# Patient Record
Sex: Male | Born: 1990 | Race: White | Hispanic: No | Marital: Single
Health system: Southern US, Community
[De-identification: ages and names within clinical notes are randomized; demographics above are authoritative.]

## PROBLEM LIST (undated history)

## (undated) DIAGNOSIS — F209 Schizophrenia, unspecified: Secondary | ICD-10-CM

## (undated) DIAGNOSIS — F22 Delusional disorders: Secondary | ICD-10-CM

## (undated) DIAGNOSIS — T148XXA Other injury of unspecified body region, initial encounter: Secondary | ICD-10-CM

## (undated) DIAGNOSIS — D649 Anemia, unspecified: Secondary | ICD-10-CM

## (undated) DIAGNOSIS — M79609 Pain in unspecified limb: Secondary | ICD-10-CM

## (undated) DIAGNOSIS — S51809A Unspecified open wound of unspecified forearm, initial encounter: Secondary | ICD-10-CM

## (undated) DIAGNOSIS — S56909A Unspecified injury of unspecified muscles, fascia and tendons at forearm level, unspecified arm, initial encounter: Secondary | ICD-10-CM

## (undated) HISTORY — DX: Pain in unspecified limb: M79.609

## (undated) HISTORY — DX: Unspecified injury of unspecified muscles, fascia and tendons at forearm level, unspecified arm, initial encounter: S56.909A

## (undated) HISTORY — DX: Other injury of unspecified body region, initial encounter: T14.8XXA

## (undated) HISTORY — DX: Unspecified open wound of unspecified forearm, initial encounter: S51.809A

## (undated) HISTORY — DX: Anemia, unspecified: D64.9

---

## 2013-12-21 ENCOUNTER — Other Ambulatory Visit: Payer: Self-pay

## 2013-12-21 ENCOUNTER — Observation Stay: Payer: No Typology Code available for payment source | Admitting: Surgery

## 2013-12-21 ENCOUNTER — Observation Stay
Admission: EM | Admit: 2013-12-21 | Discharge: 2013-12-23 | Disposition: A | Discharge status: Home / Self Care | Payer: No Typology Code available for payment source | Attending: Surgery | Admitting: Surgery

## 2013-12-21 ENCOUNTER — Encounter
Admission: EM | Disposition: A | Discharge status: Home / Self Care | Payer: Self-pay | Source: Home / Self Care | Attending: Emergency Medicine

## 2013-12-21 ENCOUNTER — Observation Stay: Payer: No Typology Code available for payment source | Admitting: Anesthesiology

## 2013-12-21 DIAGNOSIS — S56909A Unspecified injury of unspecified muscles, fascia and tendons at forearm level, unspecified arm, initial encounter: Principal | ICD-10-CM | POA: Insufficient documentation

## 2013-12-21 DIAGNOSIS — S41119A Laceration without foreign body of unspecified upper arm, initial encounter: Secondary | ICD-10-CM | POA: Diagnosis present

## 2013-12-21 DIAGNOSIS — S61409A Unspecified open wound of unspecified hand, initial encounter: Secondary | ICD-10-CM

## 2013-12-21 DIAGNOSIS — S41109A Unspecified open wound of unspecified upper arm, initial encounter: Secondary | ICD-10-CM

## 2013-12-21 DIAGNOSIS — F17200 Nicotine dependence, unspecified, uncomplicated: Secondary | ICD-10-CM | POA: Insufficient documentation

## 2013-12-21 DIAGNOSIS — W268XXA Contact with other sharp object(s), not elsewhere classified, initial encounter: Secondary | ICD-10-CM | POA: Insufficient documentation

## 2013-12-21 DIAGNOSIS — R58 Hemorrhage, not elsewhere classified: Secondary | ICD-10-CM | POA: Diagnosis present

## 2013-12-21 DIAGNOSIS — M79609 Pain in unspecified limb: Secondary | ICD-10-CM

## 2013-12-21 DIAGNOSIS — S61419A Laceration without foreign body of unspecified hand, initial encounter: Secondary | ICD-10-CM | POA: Diagnosis present

## 2013-12-21 DIAGNOSIS — Y92009 Unspecified place in unspecified non-institutional (private) residence as the place of occurrence of the external cause: Secondary | ICD-10-CM | POA: Insufficient documentation

## 2013-12-21 DIAGNOSIS — S51809A Unspecified open wound of unspecified forearm, initial encounter: Secondary | ICD-10-CM | POA: Insufficient documentation

## 2013-12-21 DIAGNOSIS — F1010 Alcohol abuse, uncomplicated: Secondary | ICD-10-CM | POA: Insufficient documentation

## 2013-12-21 DIAGNOSIS — M7989 Other specified soft tissue disorders: Secondary | ICD-10-CM | POA: Diagnosis present

## 2013-12-21 DIAGNOSIS — G8911 Acute pain due to trauma: Secondary | ICD-10-CM

## 2013-12-21 DIAGNOSIS — S51811A Laceration without foreign body of right forearm, initial encounter: Secondary | ICD-10-CM

## 2013-12-21 HISTORY — PX: REPAIR, HAND & ARM, NERVE & TENDON: SHX5251

## 2013-12-21 LAB — CBC AND DIFFERENTIAL
Basophils Absolute Automated: 0.02 10*3/uL (ref 0.00–0.20)
Basophils Automated: 0 %
Eosinophils Absolute Automated: 0.01 10*3/uL (ref 0.00–0.70)
Eosinophils Automated: 0 %
Hematocrit: 33.6 % — ABNORMAL LOW (ref 42.0–52.0)
Hgb: 11.1 g/dL — ABNORMAL LOW (ref 13.0–17.0)
Immature Granulocytes Absolute: 0.02 10*3/uL
Immature Granulocytes: 0 %
Lymphocytes Absolute Automated: 1.04 10*3/uL (ref 0.50–4.40)
Lymphocytes Automated: 11 %
MCH: 29.4 pg (ref 28.0–32.0)
MCHC: 33 g/dL (ref 32.0–36.0)
MCV: 89.1 fL (ref 80.0–100.0)
MPV: 8.2 fL — ABNORMAL LOW (ref 9.4–12.3)
Monocytes Absolute Automated: 0.29 10*3/uL (ref 0.00–1.20)
Monocytes: 3 %
Neutrophils Absolute: 8.31 10*3/uL — ABNORMAL HIGH (ref 1.80–8.10)
Neutrophils: 86 %
Nucleated RBC: 0 /100 WBC (ref 0–1)
Platelets: 226 10*3/uL (ref 140–400)
RBC: 3.77 10*6/uL — ABNORMAL LOW (ref 4.70–6.00)
RDW: 14 % (ref 12–15)
WBC: 9.69 10*3/uL (ref 3.50–10.80)

## 2013-12-21 LAB — PT AND APTT
PT INR: 1.2 — ABNORMAL HIGH (ref 0.9–1.1)
PT: 15 s (ref 12.6–15.0)
PTT: 29 s (ref 23–37)

## 2013-12-21 LAB — BASIC METABOLIC PANEL
BUN: 10 mg/dL (ref 9.0–28.0)
CO2: 21 mEq/L — ABNORMAL LOW (ref 22–29)
Calcium: 7.8 mg/dL — ABNORMAL LOW (ref 8.5–10.5)
Chloride: 108 mEq/L (ref 100–111)
Creatinine: 0.8 mg/dL (ref 0.7–1.3)
Glucose: 110 mg/dL — ABNORMAL HIGH (ref 70–100)
Potassium: 4.4 mEq/L (ref 3.5–5.1)
Sodium: 141 mEq/L (ref 136–145)

## 2013-12-21 LAB — GFR: EGFR: >60

## 2013-12-21 LAB — TYPE AND SCREEN
AB Screen Gel: NEGATIVE
ABO Rh: O NEG

## 2013-12-21 SURGERY — REPAIR, HAND & ARM, NERVE & TENDON
Anesthesia: Anesthesia General | Site: Arm Lower | Laterality: Right | Wound class: Contaminated

## 2013-12-21 MED ORDER — FERROUS SULFATE 324 (65 FE) MG PO TBEC
324.0000 mg | DELAYED_RELEASE_TABLET | Freq: Every morning | ORAL | Status: DC
Start: 2013-12-22 — End: 2013-12-22
  Administered 2013-12-22: 324 mg via ORAL
  Filled 2013-12-21: qty 1

## 2013-12-21 MED ORDER — SODIUM CHLORIDE 0.9 % IV BOLUS
1000.0000 mL | Freq: Once | INTRAVENOUS | Status: AC
Start: 2013-12-21 — End: 2013-12-21
  Administered 2013-12-21: 1000 mL via INTRAVENOUS

## 2013-12-21 MED ORDER — SODIUM CHLORIDE 0.9 % IV MBP
1.0000 g | Freq: Once | INTRAVENOUS | Status: DC
Start: 2013-12-21 — End: 2013-12-23

## 2013-12-21 MED ORDER — MORPHINE SULFATE 4 MG/ML IJ/IV SOLN (WRAP)
4.0000 mg | Status: DC | PRN
Start: 2013-12-21 — End: 2013-12-22
  Administered 2013-12-21 – 2013-12-22 (×5): 4 mg via INTRAVENOUS
  Filled 2013-12-21 (×4): qty 1

## 2013-12-21 MED ORDER — ONDANSETRON HCL 4 MG/2ML IJ SOLN
4.0000 mg | Freq: Two times a day (BID) | INTRAMUSCULAR | Status: DC | PRN
Start: 2013-12-21 — End: 2013-12-22

## 2013-12-21 MED ORDER — ONDANSETRON HCL 4 MG/2ML IJ SOLN
INTRAMUSCULAR | Status: AC
Start: 2013-12-21 — End: 2013-12-21
  Administered 2013-12-21: 4 mg via INTRAVENOUS
  Filled 2013-12-21: qty 2

## 2013-12-21 MED ORDER — MORPHINE SULFATE 4 MG/ML IJ/IV SOLN (WRAP)
Status: AC
Start: 2013-12-21 — End: 2013-12-21
  Administered 2013-12-21: 4 mg via INTRAVENOUS
  Filled 2013-12-21: qty 1

## 2013-12-21 MED ORDER — METOCLOPRAMIDE HCL 5 MG/ML IJ SOLN
INTRAMUSCULAR | Status: AC
Start: 2013-12-21 — End: ?
  Filled 2013-12-21: qty 2

## 2013-12-21 MED ORDER — NEOSTIGMINE METHYLSULFATE 1 MG/ML IJ SOLN
INTRAMUSCULAR | Status: AC
Start: 2013-12-21 — End: ?
  Filled 2013-12-21: qty 10

## 2013-12-21 MED ORDER — LUBRIFRESH P.M. OP OINT
TOPICAL_OINTMENT | OPHTHALMIC | Status: AC
Start: 2013-12-21 — End: ?
  Filled 2013-12-21: qty 3.5

## 2013-12-21 MED ORDER — FENTANYL CITRATE 0.05 MG/ML IJ SOLN
INTRAMUSCULAR | Status: DC | PRN
Start: 2013-12-21 — End: 2013-12-22
  Administered 2013-12-21 (×3): 100 ug via INTRAVENOUS

## 2013-12-21 MED ORDER — DOCUSATE SODIUM 100 MG PO CAPS
100.0000 mg | ORAL_CAPSULE | Freq: Two times a day (BID) | ORAL | Status: DC
Start: 2013-12-21 — End: 2013-12-22
  Administered 2013-12-22 (×2): 100 mg via ORAL
  Filled 2013-12-21 (×3): qty 1

## 2013-12-21 MED ORDER — LIDOCAINE HCL 2 % IJ SOLN
INTRAMUSCULAR | Status: DC | PRN
Start: 2013-12-21 — End: 2013-12-22
  Administered 2013-12-21: 100 mg

## 2013-12-21 MED ORDER — LIDOCAINE HCL (PF) 2 % IJ SOLN
INTRAMUSCULAR | Status: AC
Start: 2013-12-21 — End: ?
  Filled 2013-12-21: qty 5

## 2013-12-21 MED ORDER — ENOXAPARIN SODIUM 30 MG/0.3ML SC SOLN
30.0000 mg | Freq: Two times a day (BID) | SUBCUTANEOUS | Status: DC
Start: 2013-12-21 — End: 2013-12-22
  Administered 2013-12-21 – 2013-12-22 (×2): 30 mg via SUBCUTANEOUS
  Filled 2013-12-21 (×2): qty 0.3

## 2013-12-21 MED ORDER — MORPHINE SULFATE 4 MG/ML IJ/IV SOLN (WRAP)
4.0000 mg | Freq: Once | Status: AC
Start: 2013-12-21 — End: 2013-12-21
  Administered 2013-12-21 (×2): 4 mg via INTRAVENOUS
  Filled 2013-12-21: qty 1

## 2013-12-21 MED ORDER — METOCLOPRAMIDE HCL 5 MG/ML IJ SOLN
INTRAMUSCULAR | Status: DC | PRN
Start: 2013-12-21 — End: 2013-12-22
  Administered 2013-12-21: 10 mg via INTRAVENOUS

## 2013-12-21 MED ORDER — FAMOTIDINE 10 MG/ML IV SOLN (WRAP)
INTRAVENOUS | Status: DC | PRN
Start: 2013-12-21 — End: 2013-12-22
  Administered 2013-12-21: 20 mg via INTRAVENOUS

## 2013-12-21 MED ORDER — ONDANSETRON HCL 4 MG/2ML IJ SOLN
INTRAMUSCULAR | Status: DC | PRN
Start: 2013-12-21 — End: 2013-12-22
  Administered 2013-12-21: 4 mg via INTRAVENOUS

## 2013-12-21 MED ORDER — ROCURONIUM BROMIDE 50 MG/5ML IV SOLN
INTRAVENOUS | Status: AC
Start: 2013-12-21 — End: ?
  Filled 2013-12-21: qty 5

## 2013-12-21 MED ORDER — ACETAMINOPHEN 325 MG PO TABS
650.0000 mg | ORAL_TABLET | ORAL | Status: DC | PRN
Start: 2013-12-21 — End: 2013-12-22
  Administered 2013-12-22: 650 mg via ORAL
  Filled 2013-12-21: qty 2

## 2013-12-21 MED ORDER — ZOLPIDEM TARTRATE 5 MG PO TABS
5.0000 mg | ORAL_TABLET | Freq: Every evening | ORAL | Status: DC | PRN
Start: 2013-12-21 — End: 2013-12-22

## 2013-12-21 MED ORDER — DIPHENHYDRAMINE HCL 25 MG PO CAPS
25.0000 mg | ORAL_CAPSULE | Freq: Two times a day (BID) | ORAL | Status: DC | PRN
Start: 2013-12-21 — End: 2013-12-22

## 2013-12-21 MED ORDER — FENTANYL CITRATE 0.05 MG/ML IJ SOLN
25.0000 ug | Freq: Once | INTRAMUSCULAR | Status: AC
Start: 2013-12-21 — End: 2013-12-21

## 2013-12-21 MED ORDER — FAMOTIDINE 20 MG/2ML IV SOLN
INTRAVENOUS | Status: AC
Start: 2013-12-21 — End: ?
  Filled 2013-12-21: qty 2

## 2013-12-21 MED ORDER — DEXAMETHASONE SODIUM PHOSPHATE 20 MG/5ML IJ SOLN
INTRAMUSCULAR | Status: AC
Start: 2013-12-21 — End: ?
  Filled 2013-12-21: qty 5

## 2013-12-21 MED ORDER — PROPOFOL 10 MG/ML IV EMUL
INTRAVENOUS | Status: AC
Start: 2013-12-21 — End: ?
  Filled 2013-12-21: qty 20

## 2013-12-21 MED ORDER — MIDAZOLAM HCL 2 MG/2ML IJ SOLN
INTRAMUSCULAR | Status: DC | PRN
Start: 2013-12-21 — End: 2013-12-22
  Administered 2013-12-21: 2 mg via INTRAVENOUS

## 2013-12-21 MED ORDER — LACTATED RINGERS IV SOLN
INTRAVENOUS | Status: DC | PRN
Start: 2013-12-21 — End: 2013-12-22

## 2013-12-21 MED ORDER — MORPHINE SULFATE 2 MG/ML IJ/IV SOLN (WRAP)
2.0000 mg | Status: DC | PRN
Start: 2013-12-21 — End: 2013-12-22
  Administered 2013-12-21 – 2013-12-22 (×3): 2 mg via INTRAVENOUS
  Filled 2013-12-21 (×5): qty 1

## 2013-12-21 MED ORDER — ONDANSETRON HCL 4 MG/2ML IJ SOLN
INTRAMUSCULAR | Status: AC
Start: 2013-12-21 — End: ?
  Filled 2013-12-21: qty 2

## 2013-12-21 MED ORDER — DEXAMETHASONE SODIUM PHOSPHATE 4 MG/ML IJ SOLN (WRAP)
INTRAMUSCULAR | Status: DC | PRN
Start: 2013-12-21 — End: 2013-12-22
  Administered 2013-12-21: 10 mg via INTRAVENOUS

## 2013-12-21 MED ORDER — LACTATED RINGERS IV SOLN
INTRAVENOUS | Status: DC
Start: 2013-12-21 — End: 2013-12-22

## 2013-12-21 MED ORDER — ONDANSETRON HCL 4 MG/2ML IJ SOLN
4.0000 mg | Freq: Once | INTRAMUSCULAR | Status: AC
Start: 2013-12-21 — End: 2013-12-21
  Administered 2013-12-21: 4 mg via INTRAVENOUS
  Filled 2013-12-21: qty 2

## 2013-12-21 MED ORDER — FENTANYL CITRATE 0.05 MG/ML IJ SOLN
INTRAMUSCULAR | Status: AC
Start: 2013-12-21 — End: ?
  Filled 2013-12-21: qty 4

## 2013-12-21 MED ORDER — SENNOSIDES-DOCUSATE SODIUM 8.6-50 MG PO TABS
2.0000 | ORAL_TABLET | Freq: Two times a day (BID) | ORAL | Status: DC | PRN
Start: 2013-12-21 — End: 2013-12-23

## 2013-12-21 MED ORDER — FENTANYL CITRATE 0.05 MG/ML IJ SOLN
INTRAMUSCULAR | Status: AC
Start: 2013-12-21 — End: ?
  Filled 2013-12-21: qty 2

## 2013-12-21 MED ORDER — FENTANYL CITRATE 0.05 MG/ML IJ SOLN
INTRAMUSCULAR | Status: AC
Start: 2013-12-21 — End: 2013-12-21
  Administered 2013-12-21: 25 ug via INTRAVENOUS
  Filled 2013-12-21: qty 2

## 2013-12-21 MED ORDER — GLYCOPYRROLATE 0.2 MG/ML IJ SOLN
INTRAMUSCULAR | Status: AC
Start: 2013-12-21 — End: ?
  Filled 2013-12-21: qty 3

## 2013-12-21 MED ORDER — MIDAZOLAM HCL 2 MG/2ML IJ SOLN
INTRAMUSCULAR | Status: AC
Start: 2013-12-21 — End: ?
  Filled 2013-12-21: qty 2

## 2013-12-21 MED ORDER — FENTANYL CITRATE 0.05 MG/ML IJ SOLN
50.0000 ug | Freq: Once | INTRAMUSCULAR | Status: AC
Start: 2013-12-21 — End: 2013-12-21
  Administered 2013-12-21: 50 ug via INTRAVENOUS
  Filled 2013-12-21: qty 2

## 2013-12-21 MED ORDER — HYDROMORPHONE HCL PF 1 MG/ML IJ SOLN
INTRAMUSCULAR | Status: DC | PRN
Start: 2013-12-21 — End: 2013-12-22
  Administered 2013-12-21: 1 mg via INTRAVENOUS
  Administered 2013-12-22 (×2): 0.5 mg via INTRAVENOUS

## 2013-12-21 MED ORDER — CEFAZOLIN SODIUM 1 G IJ SOLR
INTRAMUSCULAR | Status: DC | PRN
Start: 2013-12-21 — End: 2013-12-22
  Administered 2013-12-21: 2 g via INTRAVENOUS

## 2013-12-21 MED ORDER — MORPHINE SULFATE 4 MG/ML IJ/IV SOLN (WRAP)
Status: AC
Start: 2013-12-21 — End: 2013-12-22
  Filled 2013-12-21: qty 1

## 2013-12-21 MED ORDER — SODIUM CHLORIDE 0.9 % IR SOLN
Status: DC | PRN
Start: 2013-12-21 — End: 2013-12-22
  Administered 2013-12-21: 1000 mL

## 2013-12-21 MED ORDER — PROPOFOL INFUSION 10 MG/ML
INTRAVENOUS | Status: DC | PRN
Start: 2013-12-21 — End: 2013-12-22
  Administered 2013-12-21: 100 mg via INTRAVENOUS
  Administered 2013-12-21: 200 mg via INTRAVENOUS
  Administered 2013-12-21: 100 mg via INTRAVENOUS

## 2013-12-21 MED ORDER — BACITRACIN 50000 UNITS IM SOLR
INTRAMUSCULAR | Status: DC | PRN
Start: 2013-12-21 — End: 2013-12-22
  Administered 2013-12-21: 50000 [IU]

## 2013-12-21 MED ORDER — ROCURONIUM BROMIDE 50 MG/5ML IV SOLN
INTRAVENOUS | Status: DC | PRN
Start: 2013-12-21 — End: 2013-12-22
  Administered 2013-12-21: 40 mg via INTRAVENOUS

## 2013-12-21 MED ORDER — FENTANYL CITRATE 0.05 MG/ML IJ SOLN
50.0000 ug | Freq: Once | INTRAMUSCULAR | Status: AC
Start: 2013-12-21 — End: 2013-12-21
  Administered 2013-12-21: 50 ug via INTRAVENOUS

## 2013-12-21 MED ORDER — LIDOCAINE-EPINEPHRINE 1 %-1:100000 IJ SOLN
INTRAMUSCULAR | Status: DC | PRN
Start: 2013-12-21 — End: 2013-12-22
  Administered 2013-12-21: 20 mL

## 2013-12-21 MED ORDER — TRAMADOL HCL 50 MG PO TABS
50.0000 mg | ORAL_TABLET | Freq: Four times a day (QID) | ORAL | Status: DC
Start: 2013-12-22 — End: 2013-12-22
  Administered 2013-12-22 (×2): 50 mg via ORAL
  Filled 2013-12-21 (×3): qty 1

## 2013-12-21 MED ORDER — VITAMIN C 500 MG PO TABS
500.0000 mg | ORAL_TABLET | Freq: Every morning | ORAL | Status: DC
Start: 2013-12-22 — End: 2013-12-22
  Administered 2013-12-22: 500 mg via ORAL
  Filled 2013-12-21: qty 1

## 2013-12-21 SURGICAL SUPPLY — 8 items
.035 K wire ×2 IMPLANT
BANDAGE ACE NONSTERILE 4IN LF (Bandage) ×1
BANDAGE COMPRESSION L5 YD X W4 IN ELASTIC HOOK LOOP CLOSURE STRETCH (Bandage) ×1 IMPLANT
BANDAGE MEDLINE COMPRESSION L5 YD X W4 (Bandage) ×1
DRESSING ADAPTIC NON-ADH ROLL (Dressing) ×2 IMPLANT
DRESSING FLEXZAN 4X4 (Dressing) ×2 IMPLANT
GAUZE KERLIX 4.5X4YDS (Dressing) ×2 IMPLANT
SPLINT ONESTEP 4 X 30 FIBERGL (Dressing) ×2 IMPLANT

## 2013-12-21 NOTE — ED Notes (Signed)
Pt called out c/o pain. Resident notified and gave verbal order for fentanyl IV. Went to bedside to give med and pt using profanity and threatening to leave hospital. Expressed to pt that his risks of leaving the hospital have already been discussed with him and that he may leave if he wants to. Pt continues to swear at this RN. Assisted pt to reposition, warm blankets given to pt and girlfriend. Lights dimmed. Awaiting hand surgery. Resident notified of pt voicing desire to leave.

## 2013-12-21 NOTE — ED Notes (Signed)
Trauma surgery at bedside.
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
## 2013-12-21 NOTE — ED Notes (Addendum)
See quick triage note. Resident at bedside unwrapping pressure dressing. Actively bleeding and approximately 5 inches in length, spanning circumference of upper forearm. Pt with distal PMS to that extremity.

## 2013-12-21 NOTE — H&P (Signed)
I examined the patient  No interval changes to the H and P

## 2013-12-21 NOTE — ED Notes (Signed)
Called to bedside, wound bleeding again. Resident notified.

## 2013-12-21 NOTE — ED Notes (Signed)
Bed: S 20  Expected date:   Expected time:   Means of arrival: [facility]  Comments:  Lye,Bayani

## 2013-12-21 NOTE — ED Notes (Signed)
Pt threatening to leave if he cannot have something to drink. Pt and girlfriend informed that pt may not drink but he may have mouth swabs. Pt refusing mouth swabs. Pt using profanity and swearing at this RN and trauma surgeon. Charge RN aware.

## 2013-12-21 NOTE — Plan of Care (Signed)
Report given to preop. Pt to be transported.

## 2013-12-21 NOTE — Plan of Care (Signed)
Problem: Safety  Goal: Patient will be free from injury during hospitalization  Outcome: Progressing  Call light within reach. Telephone at bedside. Encouraged to call for assistance if needed. Hourly rounding. Significant other at bedside.    Problem: Pain  Goal: Patient's pain/discomfort is manageable  Outcome: Progressing  Rates pain as 5-8; goal of <5. Morphine 2-4mg  Q2H PRN. Will assess/reassess.    Comments:   Plan is for surgery today.

## 2013-12-21 NOTE — ED Notes (Signed)
Patient and family member sleeping at this time.
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
## 2013-12-21 NOTE — Plan of Care (Signed)
Pt arrived on floor. VSS. A&Ox4. Dressing to RUE with sanguinous drainage. IVF started.

## 2013-12-21 NOTE — Progress Notes (Addendum)
Dr Lucianne Muss contacted for admission orders. Telephone orders taken for initial orders. Pt to be going to OR shortly.

## 2013-12-21 NOTE — Progress Notes (Signed)
Pt with increasing pain, OR on hold for this case r/t emergency case.  Dr. Scherrie Merritts called and orders to give floor pain med.  See MAR>

## 2013-12-21 NOTE — Anesthesia Preprocedure Evaluation (Signed)
Anesthesia Evaluation    AIRWAY    Mallampati: I    TM distance: >3 FB  Neck ROM: full  Mouth Opening:full   CARDIOVASCULAR    cardiovascular exam normal       DENTAL    no notable dental hx     PULMONARY    pulmonary exam normal     OTHER FINDINGS                      Anesthesia Plan    ASA 1     general                     intravenous induction   Detailed anesthesia plan: general LMA        Post op pain management: per surgeon    informed consent obtained    Plan discussed with CRNA and attending.

## 2013-12-21 NOTE — ED Notes (Signed)
Dr. Sherlon Handing at bedside. Wound irrigated, quik clot applied and wound redressed.

## 2013-12-21 NOTE — OR Nursing (Signed)
Post-op diagnosis:  Right long finger injury ulnar digital artery, severe compression ulnar digital nerve, Right index finger 13x57mm wound distal phalanx, 9x73mm wound PIP joint, 9x108mm wound right volar wrist, 8x11com medial right elbow wound, 7x3.6com lateral right elbow injury, flexor carpi radialis injury medial antebrachial cutaneous nerve, basilic vein injury
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
## 2013-12-22 ENCOUNTER — Encounter: Payer: Self-pay | Admitting: Anesthesiology

## 2013-12-22 ENCOUNTER — Encounter
Admission: EM | Disposition: A | Discharge status: Home / Self Care | Payer: Self-pay | Source: Home / Self Care | Attending: Emergency Medicine

## 2013-12-22 DIAGNOSIS — S51809A Unspecified open wound of unspecified forearm, initial encounter: Secondary | ICD-10-CM

## 2013-12-22 LAB — BASIC METABOLIC PANEL
BUN: 8 mg/dL — ABNORMAL LOW (ref 9.0–28.0)
CO2: 27 mEq/L (ref 22–29)
Calcium: 8.2 mg/dL — ABNORMAL LOW (ref 8.5–10.5)
Chloride: 101 mEq/L (ref 100–111)
Creatinine: 0.8 mg/dL (ref 0.7–1.3)
Glucose: 123 mg/dL — ABNORMAL HIGH (ref 70–100)
Potassium: 4.1 mEq/L (ref 3.5–5.1)
Sodium: 132 mEq/L — ABNORMAL LOW (ref 136–145)

## 2013-12-22 LAB — CBC
Hematocrit: 28.7 % — ABNORMAL LOW (ref 42.0–52.0)
Hgb: 9.4 g/dL — ABNORMAL LOW (ref 13.0–17.0)
MCH: 29.3 pg (ref 28.0–32.0)
MCHC: 32.8 g/dL (ref 32.0–36.0)
MCV: 89.4 fL (ref 80.0–100.0)
MPV: 8.4 fL — ABNORMAL LOW (ref 9.4–12.3)
Nucleated RBC: 0 /100 WBC (ref 0–1)
Platelets: 194 10*3/uL (ref 140–400)
RBC: 3.21 10*6/uL — ABNORMAL LOW (ref 4.70–6.00)
RDW: 14 % (ref 12–15)
WBC: 7.16 10*3/uL (ref 3.50–10.80)

## 2013-12-22 LAB — GFR: EGFR: >60

## 2013-12-22 SURGERY — BLOCK, BRACHIAL PLEXUS, SINGLE
Anesthesia: Anesthesia General

## 2013-12-22 MED ORDER — HYDROMORPHONE HCL 2 MG PO TABS
2.0000 mg | ORAL_TABLET | ORAL | Status: DC | PRN
Start: 2013-12-22 — End: 2013-12-23

## 2013-12-22 MED ORDER — OXYCODONE-ACETAMINOPHEN 5-325 MG PO TABS
1.0000 | ORAL_TABLET | ORAL | Status: DC | PRN
Start: 2013-12-22 — End: 2013-12-22

## 2013-12-22 MED ORDER — ONDANSETRON HCL 4 MG/2ML IJ SOLN
4.0000 mg | Freq: Once | INTRAMUSCULAR | Status: DC | PRN
Start: 2013-12-22 — End: 2013-12-22

## 2013-12-22 MED ORDER — BUPIVACAINE-EPINEPHRINE (PF) 0.5% -1:200000 IJ SOLN
INTRAMUSCULAR | Status: DC | PRN
Start: 2013-12-22 — End: 2013-12-22
  Administered 2013-12-22: 20 mL via INTRAMUSCULAR

## 2013-12-22 MED ORDER — BACITRACIN ZINC 500 UNIT/GM EX OINT
TOPICAL_OINTMENT | CUTANEOUS | Status: DC | PRN
Start: 2013-12-22 — End: 2013-12-22
  Administered 2013-12-22: 1 via TOPICAL

## 2013-12-22 MED ORDER — HYDROMORPHONE HCL 2 MG PO TABS
2.0000 mg | ORAL_TABLET | ORAL | Status: DC | PRN
Start: 2013-12-22 — End: 2013-12-23
  Administered 2013-12-22: 2 mg via ORAL
  Filled 2013-12-22: qty 1

## 2013-12-22 MED ORDER — HYDROMORPHONE HCL 2 MG PO TABS
2.0000 mg | ORAL_TABLET | ORAL | Status: DC | PRN
Start: 2013-12-22 — End: 2013-12-22
  Administered 2013-12-22 (×3): 2 mg via ORAL
  Filled 2013-12-22 (×3): qty 1

## 2013-12-22 MED ORDER — FENTANYL CITRATE 0.05 MG/ML IJ SOLN
25.0000 ug | INTRAMUSCULAR | Status: DC | PRN
Start: 2013-12-22 — End: 2013-12-22

## 2013-12-22 MED ORDER — HYDROMORPHONE HCL PF 1 MG/ML IJ SOLN
INTRAMUSCULAR | Status: AC
Start: 2013-12-22 — End: ?
  Filled 2013-12-22: qty 1

## 2013-12-22 MED ORDER — HYDROMORPHONE HCL PF 1 MG/ML IJ SOLN
0.5000 mg | INTRAMUSCULAR | Status: DC | PRN
Start: 2013-12-22 — End: 2013-12-22

## 2013-12-22 MED ORDER — HYDROMORPHONE HCL 2 MG PO TABS
2.0000 mg | ORAL_TABLET | ORAL | Status: DC | PRN
Start: 2013-12-22 — End: 2014-02-17

## 2013-12-22 MED ORDER — SENNOSIDES-DOCUSATE SODIUM 8.6-50 MG PO TABS
2.0000 | ORAL_TABLET | Freq: Two times a day (BID) | ORAL | Status: DC | PRN
Start: 2013-12-22 — End: 2014-02-17

## 2013-12-22 MED ORDER — GLYCOPYRROLATE 0.2 MG/ML IJ SOLN
INTRAMUSCULAR | Status: DC | PRN
Start: 2013-12-22 — End: 2013-12-22
  Administered 2013-12-22: .8 mg via INTRAVENOUS

## 2013-12-22 MED ORDER — OXYCODONE-ACETAMINOPHEN 5-325 MG PO TABS
1.0000 | ORAL_TABLET | Freq: Once | ORAL | Status: AC | PRN
Start: 2013-12-22 — End: 2013-12-22
  Administered 2013-12-22: 1 via ORAL
  Filled 2013-12-22: qty 1

## 2013-12-22 MED ORDER — SULFAMETHOXAZOLE-TMP DS 800-160 MG PO TABS
1.0000 | ORAL_TABLET | Freq: Two times a day (BID) | ORAL | Status: DC
Start: 2013-12-23 — End: 2013-12-23
  Administered 2013-12-22: 1 via ORAL
  Filled 2013-12-22: qty 1

## 2013-12-22 MED ORDER — PROMETHAZINE HCL 25 MG/ML IJ SOLN
6.2500 mg | Freq: Once | INTRAMUSCULAR | Status: DC | PRN
Start: 2013-12-22 — End: 2013-12-22

## 2013-12-22 MED ORDER — NEOSTIGMINE METHYLSULFATE 1 MG/ML IJ SOLN
INTRAMUSCULAR | Status: DC | PRN
Start: 2013-12-22 — End: 2013-12-22
  Administered 2013-12-22: 4 mg via INTRAVENOUS

## 2013-12-22 MED ORDER — SULFAMETHOXAZOLE-TMP DS 800-160 MG PO TABS
1.0000 | ORAL_TABLET | Freq: Two times a day (BID) | ORAL | Status: DC
Start: 2013-12-23 — End: 2014-02-17
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
## 2013-12-22 NOTE — OR Nursing (Signed)
See MD OP note for complete procedure
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
## 2013-12-22 NOTE — Plan of Care (Signed)
Problem: Pain  Goal: Patient's pain/discomfort is manageable  Outcome: Progressing  Medicated with dilaudid 2 mg po for RUE pain 5 out of 10.RUE with splint and ace wrap cdi,elevated with pillow.Able to wiggle all fingers and warm to touch.Denies numbness or tingling.

## 2013-12-22 NOTE — Progress Notes (Signed)
Called Dr. Remus Blake office, and spoke with [facility], Museum/gallery conservator .

## 2013-12-22 NOTE — Consults (Signed)
ANESTHESIOLOGY INPATIENT PAIN SERVICE CONSULTATION    Assessment:   POD #1 s/p hand and arm laceration repair.   Taking IV Morphine 4 mg and PO Percocet.   Just taking 2 mg Dilaudid q4h PRN with analgesia    Plan:   Would advocate for Dilaudid 2 mg PO q2h prn as it is unlikely to last for 4 hours   Will isgn off this case.  Recontact [place] if you need additional help  As a consultation service we will not be writing for titration of the opioids.  Please increase or decrease from this starting dosage as needed.  A patient with continuous pain may derive benefit from a long acting opioid dosage in addition to the short acting narcotics.  Side effects of somnolence, constipation, nausea, and respiratory depression are more frequent with increasing administrations.    Pain Rx:  As above and below    All Rx:  Scheduled Meds:  Current Facility-Administered Medications   Medication Dose Route Frequency   . ceFAZolin  1 g Intravenous Once   . docusate sodium  100 mg Oral BID   . enoxaparin  30 mg Subcutaneous Q12H   . Ferrous Sulfate  324 mg Oral QAM W/BREAKFAST   . traMADol  50 mg Oral 4 times per day   . vitamin C  500 mg Oral QAM W/BREAKFAST     Continuous Infusions:  . lactated ringers 100 mL/hr at 12/21/13 0817   . lactated ringers 100 mL/hr at 12/22/13 0259     PRN Meds:.acetaminophen, diphenhydrAMINE, fentaNYL, HYDROmorphone, HYDROmorphone, morphine, morphine, ondansetron, ondansetron, oxyCODONE-acetaminophen, promethazine, senna-docusate, zolpidem    Diet:  Diet regular    Interval History:  Patient reports mild pain.  Pain Location: right forearm and hand  Comments: coping with current regimen    History:  Karl [name] is a 23 y.o. male admitted on 12/21/2013 with Laceration of right forearm, initial encounter [881.00]  Laceration of arm, right, initial encounter [884.0].  LOS:  LOS: 1 day     Surgery: s/p Procedure(s):  REPAIR, HAND & ARM, NERVE & TENDON for Laceration of arm, right, initial encounter [884.0].     POD: 1 Day Post-Op    Additional History:  Past Medical History   Diagnosis Date   . Back pain    . Coma      history     History reviewed. No pertinent past surgical history.    Allergies:  No Known Allergies    Review of Systems:  RR: 16/min  Side effects: no:  None, but will have constipation.    Additional ROS:      Exam:  Filed Vitals:    12/22/13 1125   BP: 114/51   Pulse: 70   Temp: 36.2 C (97.2 F)   Resp: 16   SpO2: 97%       Level of Consciousness: awake and alert   Additional Exam:      Labs:  Recent Labs      12/22/13   0142   WBC  7.16   HEMOGLOBIN  9.4*   HEMATOCRIT  28.7*     No results for input(s): PT, INR, PTT in the last 24 hours.  Recent Labs      12/22/13   0142   BUN  8.0*   CREATININE  0.8   POTASSIUM  4.1       Level of Service:  2    Signature:  Avanell Shackleton 12/22/2013 12:59 PM

## 2013-12-22 NOTE — Anesthesia Postprocedure Evaluation (Signed)
The patient is awake or easily arousable.      The patients respirations, and cardiovascular status have been evaluated and deemed stable post op.     Post op nausea, vomiting and pain have been treated and controlled as effectively as possible without compromising the patients respiratory and cardiovascular status.    Please refer to Post Op PACU Documentation for confirmation of attainment of normothermia and adequate hydration status.    There were no obvious anesthetic related complications.

## 2013-12-22 NOTE — Op Note (Signed)
Right forearm/wrist/long and index finger traumatic lacerations explorations  Right forearm irrigation and debridement skin, subcutaneous tissue and muscle  Neurolysis ulnar nerve proximal forearm  Local rotational flap to cover a 8x11 cm medial proximal forearm defect  Ligation of cephalic vein right forearm  Repair Flexor carpi radialis right  Irrigation and debridement skin and muscle right lateral elbow   Local rotational flap to cover a 7 x 3.6 cm open wound   Resection lateral antebrachial cutaneous nerve fascicle with muscle implantation   Irrigation debridement right volar radial wrist wound skin and subcutaneous tissue  Vy advancement flap to cover 9x13 mm open wound   Percutaenous pinning right index DIP joint  Irrigation right index volar dip and pip skin and subcutaneous tissue  Full thickness skin graft to cover a 9 x9 mm wound PIP joint and 13 x 9 Distal phalanx wound  Irrigation and debridement right long finger   Ligation ulnar digital vein right long finger  Neurolysis ulnar proper digital nerve right long finger  Vascular assessment right arterial inflow       Surgeon: Sheritha Louis   Anesthesia: General  IVF: 1600  EBL 95 cc  TT: 27 min  Drains: one JP  Disposition: inpatient for acute pain mgmt, IV antibiotics, discharge planning; elevate right arm and follow up with Dr Lucianne Muss in 7 days call [phone]; teach JP drain care

## 2013-12-22 NOTE — Plan of Care (Signed)
Problem: Pain  Goal: Patient's pain/discomfort is manageable  Outcome: Progressing  Pain level of 6/10 pain medicine given with relief.
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
## 2013-12-22 NOTE — Transfer of Care (Signed)
Anesthesia Transfer of Care Note    Patient: Karl [name]    Procedures performed: Procedure(s):  REPAIR, HAND & ARM, NERVE & TENDON    Anesthesia type: General ETT    Patient location:Phase I PACU    Last vitals:   Filed Vitals:    12/22/13 0043   BP: 151/81   Pulse: 98   Temp: 36.8 C (98.2 F)   Resp: 24   SpO2: 99%       Post pain: Patient not complaining of pain, continue current therapy      Mental Status:awake and alert     Respiratory Function: tolerating face mask    Cardiovascular: stable    Nausea/Vomiting: patient not complaining of nausea or vomiting    Hydration Status: adequate    Post assessment: no apparent anesthetic complications, no reportable events and no evidence of recall

## 2013-12-22 NOTE — Progress Notes (Signed)
Patient verbalized pain now controled with PO Dilaudid. No noted side effects, tolerating diet well and verbalized ready to [place] once seen by the doctor.

## 2013-12-22 NOTE — Progress Notes (Signed)
Pt alert and oriented x 4.Denies sob or chest pain.Pt upset waiting for physician to see him for discharge.Called and spoke with Dr.Hashemi ,came in to see pt.Discharge instructions given and explained to pt including prescriptions,verbalizes understanding.Pt doesn't want wheelchair assist for discharge.Pt discharged  Accompanied by girlfriend with all belongings ,ambulatory with steady gait.

## 2013-12-22 NOTE — Discharge Instructions (Signed)
No smoking  Keep right hand elevated  Plastic bag cover for showering  6 pack finger exercises  JP drain care (record every 12 hours)  Call hand therapist  See Dr Lucianne Muss in 1 week

## 2013-12-22 NOTE — OR PreOp (Signed)
Pt arrived to preop.  No nerve block needed at this time per Dr. Marny Lowenstein.

## 2013-12-22 NOTE — Progress Notes (Signed)
Patient was seen and examined.  5/10 pain  No headache or no chest pain    Wound cultures: no growth to date    Physical Exam:   Right hand: finger warm, no evidence of superinfection outside of the splint  Right index: k wire clean and dry    Impression: Degloving injury right forearm/wrist and index/long/ring fingers    Plan:    Per patient request, discharge home  Dilaudid for postop pain  Bactrim antibiotics  Colace for constipation

## 2013-12-23 LAB — ECG 12-LEAD
Atrial Rate: 68 {beats}/min
P Axis: 65 degrees
P-R Interval: 132 ms
Q-T Interval: 396 ms
QRS Duration: 80 ms
QTC Calculation (Bezet): 421 ms
R Axis: 49 degrees
T Axis: 54 degrees
Ventricular Rate: 68 {beats}/min

## 2013-12-27 ENCOUNTER — Encounter: Payer: Self-pay | Admitting: Surgery

## 2014-01-28 ENCOUNTER — Ambulatory Visit: Payer: No Typology Code available for payment source

## 2014-01-28 NOTE — Pre-Procedure Instructions (Signed)
01/28/14 Preoperative labs and EKG notes reviewed in Epic.

## 2014-01-30 ENCOUNTER — Ambulatory Visit: Admit: 2014-01-30 | Payer: No Typology Code available for payment source | Admitting: Surgery

## 2014-01-30 SURGERY — REMOVAL, HARDWARE, SIMPLE, NON-SPINE
Anesthesia: General | Laterality: Right

## 2014-02-04 ENCOUNTER — Ambulatory Visit
Admission: RE | Admit: 2014-02-04 | Payer: No Typology Code available for payment source | Source: Ambulatory Visit | Admitting: Surgery

## 2014-02-04 ENCOUNTER — Encounter: Admission: RE | Payer: Self-pay | Source: Ambulatory Visit

## 2014-02-04 SURGERY — REMOVAL, HARDWARE, SIMPLE, NON-SPINE
Anesthesia: General | Laterality: Right

## 2014-02-17 ENCOUNTER — Ambulatory Visit: Payer: No Typology Code available for payment source

## 2014-02-17 ENCOUNTER — Encounter: Payer: Self-pay | Admitting: Anesthesiology

## 2014-02-17 ENCOUNTER — Ambulatory Visit: Payer: No Typology Code available for payment source | Admitting: Anesthesiology

## 2014-02-17 ENCOUNTER — Encounter
Admission: RE | Disposition: A | Discharge status: Home / Self Care | Payer: Self-pay | Source: Ambulatory Visit | Attending: Surgery

## 2014-02-17 ENCOUNTER — Ambulatory Visit: Payer: No Typology Code available for payment source | Admitting: Surgery

## 2014-02-17 ENCOUNTER — Ambulatory Visit
Admission: RE | Admit: 2014-02-17 | Discharge: 2014-02-17 | Disposition: A | Discharge status: Home / Self Care | Payer: No Typology Code available for payment source | Source: Ambulatory Visit | Attending: Surgery | Admitting: Surgery

## 2014-02-17 DIAGNOSIS — S51801S Unspecified open wound of right forearm, sequela: Secondary | ICD-10-CM

## 2014-02-17 DIAGNOSIS — F17200 Nicotine dependence, unspecified, uncomplicated: Secondary | ICD-10-CM | POA: Insufficient documentation

## 2014-02-17 DIAGNOSIS — Z1889 Other specified retained foreign body fragments: Secondary | ICD-10-CM | POA: Insufficient documentation

## 2014-02-17 DIAGNOSIS — Z472 Encounter for removal of internal fixation device: Secondary | ICD-10-CM | POA: Insufficient documentation

## 2014-02-17 DIAGNOSIS — M795 Residual foreign body in soft tissue: Secondary | ICD-10-CM | POA: Insufficient documentation

## 2014-02-17 DIAGNOSIS — D649 Anemia, unspecified: Secondary | ICD-10-CM | POA: Insufficient documentation

## 2014-02-17 HISTORY — PX: REMOVAL, HARDWARE, SIMPLE, NON-SPINE: SHX5122

## 2014-02-17 HISTORY — PX: DEBRIDEMENT & IRRIGATION, UPPER EXTREMITY: SHX3695

## 2014-02-17 SURGERY — REMOVAL, HARDWARE, SIMPLE, NON-SPINE
Anesthesia: Anesthesia General | Site: Finger | Laterality: Right | Wound class: Clean

## 2014-02-17 MED ORDER — BUPIVACAINE-EPINEPHRINE (PF) 0.5% -1:200000 IJ SOLN
INTRAMUSCULAR | Status: DC
Start: 2014-02-17 — End: 2014-02-17
  Filled 2014-02-17: qty 20

## 2014-02-17 MED ORDER — PROPOFOL 10 MG/ML IV EMUL
INTRAVENOUS | Status: AC
Start: 2014-02-17 — End: ?
  Filled 2014-02-17: qty 50

## 2014-02-17 MED ORDER — PROPOFOL INFUSION 10 MG/ML
INTRAVENOUS | Status: DC | PRN
Start: 2014-02-17 — End: 2014-02-17
  Administered 2014-02-17: 300 ug/kg/min via INTRAVENOUS

## 2014-02-17 MED ORDER — LIDOCAINE HCL (PF) 2 % IJ SOLN
INTRAMUSCULAR | Status: AC
Start: 2014-02-17 — End: ?
  Filled 2014-02-17: qty 5

## 2014-02-17 MED ORDER — FENTANYL CITRATE 0.05 MG/ML IJ SOLN
INTRAMUSCULAR | Status: AC
Start: 2014-02-17 — End: ?
  Filled 2014-02-17: qty 2

## 2014-02-17 MED ORDER — GLYCOPYRROLATE 0.2 MG/ML IJ SOLN
INTRAMUSCULAR | Status: AC
Start: 2014-02-17 — End: ?
  Filled 2014-02-17: qty 1

## 2014-02-17 MED ORDER — FENTANYL CITRATE 0.05 MG/ML IJ SOLN
INTRAMUSCULAR | Status: DC | PRN
Start: 2014-02-17 — End: 2014-02-17
  Administered 2014-02-17 (×2): 50 ug via INTRAVENOUS

## 2014-02-17 MED ORDER — SODIUM CHLORIDE 0.9 % IV MBP
1.0000 g | Freq: Three times a day (TID) | INTRAVENOUS | Status: DC
Start: 2014-02-17 — End: 2014-02-17
  Administered 2014-02-17: 1 g via INTRAVENOUS

## 2014-02-17 MED ORDER — PROPOFOL INFUSION 10 MG/ML
INTRAVENOUS | Status: DC | PRN
Start: 2014-02-17 — End: 2014-02-17
  Administered 2014-02-17: 50 mg via INTRAVENOUS
  Administered 2014-02-17: 200 mg via INTRAVENOUS
  Administered 2014-02-17 (×3): 50 mg via INTRAVENOUS

## 2014-02-17 MED ORDER — GLYCOPYRROLATE 0.2 MG/ML IJ SOLN
INTRAMUSCULAR | Status: DC | PRN
Start: 2014-02-17 — End: 2014-02-17
  Administered 2014-02-17: 0.2 mg via INTRAVENOUS

## 2014-02-17 MED ORDER — MIDAZOLAM HCL 2 MG/2ML IJ SOLN
INTRAMUSCULAR | Status: AC
Start: 2014-02-17 — End: ?
  Filled 2014-02-17: qty 2

## 2014-02-17 MED ORDER — ONDANSETRON HCL 4 MG/2ML IJ SOLN
INTRAMUSCULAR | Status: AC
Start: 2014-02-17 — End: ?
  Filled 2014-02-17: qty 2

## 2014-02-17 MED ORDER — MIDAZOLAM HCL 2 MG/2ML IJ SOLN
INTRAMUSCULAR | Status: DC | PRN
Start: 2014-02-17 — End: 2014-02-17
  Administered 2014-02-17: 2 mg via INTRAVENOUS
  Administered 2014-02-17 (×2): 1 mg via INTRAVENOUS

## 2014-02-17 MED ORDER — CEFAZOLIN SODIUM 1 G IJ SOLR
INTRAMUSCULAR | Status: AC
Start: 2014-02-17 — End: ?
  Filled 2014-02-17: qty 1000

## 2014-02-17 MED ORDER — PROPOFOL 10 MG/ML IV EMUL
INTRAVENOUS | Status: AC
Start: 2014-02-17 — End: ?
  Filled 2014-02-17: qty 20

## 2014-02-17 MED ORDER — LIDOCAINE-EPINEPHRINE 1 %-1:100000 IJ SOLN
INTRAMUSCULAR | Status: DC
Start: 2014-02-17 — End: 2014-02-17
  Filled 2014-02-17: qty 20

## 2014-02-17 MED ORDER — LIDOCAINE-EPINEPHRINE 1 %-1:100000 IJ SOLN
INTRAMUSCULAR | Status: DC | PRN
Start: 2014-02-17 — End: 2014-02-17
  Administered 2014-02-17: 20 mL

## 2014-02-17 MED ORDER — LACTATED RINGERS IV SOLN
INTRAVENOUS | Status: DC | PRN
Start: 2014-02-17 — End: 2014-02-17

## 2014-02-17 SURGICAL SUPPLY — 55 items
ADSON KIT BIPOLAR 4 3/4 (Cautery) ×1
APPLCATOR CHLORAPREP 26ML (Prep) ×2 IMPLANT
BANDAGE ACE STERILE 4IN LF (Bandage) ×1
BANDAGE BANDAID SPOT 7/8IN (Ortho Supply) ×1 IMPLANT
BANDAGE CMPR PLSTR CTTN CBN 1IN LF STRL (Bandage) ×1
BANDAGE COBAN 1" NS (Bandage) ×1
BANDAGE COBAN 1IN STRL COMP LF ELASTIC SLF ADHERENT CHSV PLSTR COTTON (Bandage) IMPLANT
BANDAGE MEDIUM COMPRESSION L5 YD X W4 IN ELASTIC HOOK LOOP CLOSURE (Bandage) ×1 IMPLANT
BANDAGE MEDLINE MEDIUM COMPRESSION L5 YD (Bandage) ×1
BNDG ACE ELASTIC 4IN STRL (Procedure Accessories) ×2 IMPLANT
BNDG WEBRIL NONSTRL 4IN (Procedure Accessories) ×1
DRAIN PENROSE 1/4IN X 18IN (Drain) ×2 IMPLANT
DRAPE C-ARM MINI (Drape) ×2 IMPLANT
DRESSING ABDOMINAL ONE-SZ (Dressing) ×2 IMPLANT
DRESSING ADAPTIC NON-ADH ROLL (Dressing) ×2 IMPLANT
DRESSING NON-ADHERING 3 X 16 (Dressing) ×2 IMPLANT
FORCEP BIPOLAR BAYNET STRT 8IN (Cautery) IMPLANT
GAUZE KERLIX 4.5X4YDS (Dressing) ×4 IMPLANT
GAUZE SPONGE 4X4 NS (Dressing) ×1
GLOVE SSURG SZ 7.5 (Glove) ×2 IMPLANT
GLOVE SURG BIOGEL INDIC SZ 7.5 (Glove) ×2 IMPLANT
GLOVE SURG LTX SZ 8 STRL (Glove) ×2 IMPLANT
HRST DONUT HL-IN-ONE (Positioning Supplies) ×2 IMPLANT
KIT FORCEPS L5.51 IN STRAIGHT BIPOLAR (Cautery) ×1
KIT FORCEPS L5.51 IN STRAIGHT BIPOLAR INSULATE SERRATE OLSEN (Cautery) ×1 IMPLANT
KIT HAND AND UPPER EXTREMITY (Pack) ×2 IMPLANT
NEEDLE 25GA 1 1/2 (Needles) ×2 IMPLANT
NEEDLE DISP 27GX1.5IN (Needles) ×2 IMPLANT
NEEDLE REG BEVEL 19GX1.5IN (Needles) ×2 IMPLANT
PAD ARMBOARD 20X8X2IN (Procedure Accessories) ×2 IMPLANT
PAD ELECTROSRG GRND REM W CRD (Procedure Accessories) ×2 IMPLANT
PAD PREP CUFF 24X41IN W 9IN (Prep) ×2 IMPLANT
PADDING CAST L4 YD X W4 IN COHESION HAND TEARABLE SELF BOND SPECIALIST (Procedure Accessories) ×1 IMPLANT
PADDING CST CTTN SPCLST 100 4YDX4IN LF (Procedure Accessories) ×1
PULSAVAC HIP KIT EA=1 (Kits) ×2 IMPLANT
SLEEVE SEQUEN COMP KNEE REG (Procedure Accessories) ×2 IMPLANT
SOLUTION BETADINE 4OZ (Scrub Supplies) ×1
SOLUTION SRGSCRB 10% PVP IOD 4OZ PLS BTL (Scrub Supplies) ×1
SOLUTION SURGICAL SCRUB 10% PVP IODINE 4OZ PLASTIC BOTTLE AQUEOUS SKIN (Scrub Supplies) ×1 IMPLANT
SPLINT CAST FIBERGLASS 3X12IN (Cast) ×2 IMPLANT
SPLINT CONFORMABLE 4X15IN (Cast) ×2 IMPLANT
SPONGE GAUZE L4 IN X W4 IN 16 PLY (Dressing) ×1
SPONGE GAUZE L4 IN X W4 IN 16 PLY MAXIMUM ABSORBENT USP TYPE VII (Dressing) ×1 IMPLANT
STAPLER SKIN REG (Skin Closure) ×2 IMPLANT
SYRINGE 20 ML BD LUER-LOK MEDICAL (Syringes, Needles) ×1 IMPLANT
SYRINGE LUER LOCK 10CC (Syringes, Needles) ×2 IMPLANT
SYRINGE LUER-LOK STERILE 20CC (Syringes, Needles) ×1
SYRINGE MED 20ML LL LF STRL (Syringes, Needles) ×1
TAPE SURGICAL DURAPORE 3IN (Tape) ×2 IMPLANT
TOURNIQUET 18IN STRL (Procedure Accessories) ×2 IMPLANT
TOWEL STERILE 6-PACK (Procedure Accessories) ×2 IMPLANT
TOWEL STERILE REUSABLE 8PK (Procedure Accessories) ×2 IMPLANT
TRAY DRY SKIN SCRUB (Prep) ×1
TRAY SKIN SCRUB L8 IN 6 WING 6 SPONGE STICK 2 TIP APPLICATOR DRY VINYL (Prep) ×1 IMPLANT
TRAY SKIN SCRUB MEDLINE L8 IN VINYL (Prep) ×1

## 2014-02-17 NOTE — OR PreOp (Signed)
Pt updated

## 2014-02-17 NOTE — H&P (Signed)
No interval changes to his H and P

## 2014-02-17 NOTE — OR PreOp (Signed)
Pt alert and oriented. No c/o pain or discomfort. Given updates.

## 2014-02-17 NOTE — Transfer of Care (Signed)
Anesthesia Transfer of Care Note    Patient: Karl [name]    Procedures performed: Procedure(s) with comments:  REMOVAL, HARDWARE, SIMPLE, NON-SPINE - RIGHT INDEX FINGER HARDWARE REMOVAL & DEBRIDEMENT  DEBRIDEMENT & IRRIGATION, UPPER EXTREMITY    Anesthesia type: General LMA    Patient location:Phase I PACU    Last vitals:   Filed Vitals:    02/17/14 1731   BP: 128/59   Pulse: 112   Temp: 36.4 C (97.5 F)   Resp: 18   SpO2: 100%       Post pain: Patient not complaining of pain, continue current therapy      Mental Status:awake    Respiratory Function: tolerating face mask    Cardiovascular: stable    Nausea/Vomiting: patient not complaining of nausea or vomiting    Hydration Status: adequate    Post assessment: no apparent anesthetic complications, no reportable events and no evidence of recall

## 2014-02-17 NOTE — Op Note (Signed)
Right index deep hardware removal   Right index irrigation and debridement volar PIP flexion crease  Irrigation and debridement right forearm skin and subcutaneous tissue  Medial antebrachial cutaneous and median nerve block right     Surgeon: Lucianne Muss   Assistant: Donzetta Sprung  Anesthesia: Ok Edwards  EBL:3cc  IVF: 1000  Tourniquet: not utilized  Complication: none  Disposition: discharge home when meets PACU criteria, start hand therapy, no antibiotics, patient is not complyijng to postop plan, requires EMG right rule posttraumatic ulnar nerve injury; tylenol for pain

## 2014-02-17 NOTE — Discharge Instructions (Signed)
No aspirin or aspirin-containing products until advised by your surgeon.  Use pain medication as directed by your surgeon.  Begin with clear liquids and may progress to your normal diet if not nauseated. Avoid greasy, acidic, or spicy food.  Notify surgeon if:   Persistent nausea or vomiting   Chills, fever (above 101 degrees)   Persistent bleeding or swelling at operative site.   Unable to urinate in 6-8 hours.   Pain that is not relieved by pain medication    Surgical incisions require special care to assure proper healing and to prevent infection. Closed surgical incisions, sutures, staples, steri-strips (surgical tape) or surgical glue all requires the special attention and care.     All surgical incisions must me watched for signs of infection and report to your surgeon if any signs appear.    Redness  Excessive swelling  Streaking   Pus  Fever (more the 101 degrees after surgery)    Do not remove any dressing unless your nurse or doctor instructs you to. Most dressings will be removed after 48-72 hours but any steri-strips (surgical tapes) under the dressing or any steri-strips used alone should NOT be removed. They will fall off on their own. The tapes are often glued in place to assure the incision remains aligned. Premature removal or pulling on the incision site may open to incision. Do NOT pull or help them to fall off. It may take 5-14 days for these tapes to fall off. Some incisions may be closed with surgical glue alone. Do not use any ointments or lotions as this may dissolve the glue. Do not touch the incision and do not allow your child to pick at the glue, steri-strips or dressings. Remember frequent hand washing is the best way to prevent infection.   Keep the surgical site dry.  After 48-72 hours most gauze dressings can be removed and the patient may shower at that time. Swimming, sitting in bath water, hot tubs or submersion in water can cause infection. Do not submerge the site until cleared  by your surgeon, usually 1 week after surgery.  Steri-strips may get wet after 48 hours. We recommend showering with soap and water and pat the site dry. Your surgeon will give you specific instructions for your child.      Follow-up with Dr. Lucianne Muss 2-3 weeks. Start hand therapy as soon as possible.    Keep right index finger dry for 48 hours  See hand therapy within 48 hours to start ROM protocol.  Schedule EMG RUE  Call with any concerns

## 2014-02-17 NOTE — Anesthesia Postprocedure Evaluation (Signed)
Anesthesia Post Evaluation    Patient: Karl [name]    Procedures performed: Procedure(s) with comments:  REMOVAL, HARDWARE, SIMPLE, NON-SPINE - RIGHT INDEX FINGER HARDWARE REMOVAL & DEBRIDEMENT  DEBRIDEMENT & IRRIGATION, UPPER EXTREMITY    The patient is awake or easily arousable.      The patients respirations, and cardiovascular status have been evaluated and deemed stable post op.     Post op nausea, vomiting and pain have been treated and controlled as effectively as possible without compromising the patients respiratory and cardiovascular status.    Please refer to Post Op PACU Documentation for confirmation of attainment of normothermia and adequate hydration status.    There were no obvious anesthetic related complications.    The patient has recovered adequately to be transfered to floor at the present time.

## 2014-02-17 NOTE — Anesthesia Preprocedure Evaluation (Signed)
Anesthesia Evaluation    AIRWAY    Mallampati: II    TM distance: >3 FB  Neck ROM: full  Mouth Opening:full   CARDIOVASCULAR    cardiovascular exam normal       DENTAL    no notable dental hx     PULMONARY    pulmonary exam normal     OTHER FINDINGS                      Anesthesia Plan    ASA 2     general                     intravenous induction   Detailed anesthesia plan: general LMA  Monitors/Adjuncts: other    Post Op: other  Post op pain management: per surgeon    informed consent obtained    Plan discussed with CRNA.                   ===============================================================  Inpatient Anesthesia Evaluation    Patient Name: Karl [name],Karl [name]  Surgeon: Rosario Adie, MD  Patient Age / Sex: 23 y.o. / male    Medical History:     Past Medical History   Diagnosis Date   . Open wound of forearm, with tendon involvement    . Injury to blood vessels, unspecified site    . Pain in limb      Arm and Finger   . Anemia        Past Surgical History   Procedure Laterality Date   . Repair, hand & arm, nerve & tendon Right 12/21/2013     Procedure: REPAIR, HAND & ARM, NERVE & TENDON;  Surgeon: Rosario Adie, MD;  Location: [place] [facility] OR;  Service: Plastics;  Laterality: Right;  Right forearm/wrist/long and index finger traumatic lacerations explorations           Allergies:   No Known Allergies      Medications:     No current facility-administered medications for this encounter.              Prior to Admission medications    Medication Sig Start Date End Date Taking? Authorizing Provider   HYDROmorphone (DILAUDID) 2 MG tablet Take 1 tablet (2 mg total) by mouth every 3 (three) hours as needed. 12/22/13 02/17/14  Rosario Adie, MD   senna-docusate (PERICOLACE) 8.6-50 MG per tablet Take 2 tablets by mouth 2 (two) times daily as needed for Constipation. 12/22/13 02/17/14  Rosario Adie, MD   sulfamethoxazole-trimethoprim (BACTRIM DS) 800-160 MG per tablet Take 1 tablet  by mouth every 12 (twelve) hours. 12/23/13 02/17/14  Rosario Adie, MD     Vitals   Temp:  [36.9 C (98.4 F)] 36.9 C (98.4 F)  Heart Rate:  [74] 74  Resp Rate:  [20] 20  BP: (125)/(78) 125/78 mmHg    Wt Readings from Last 3 Encounters:   02/17/14 65.1 kg (143 lb 8.3 oz)   01/28/14 65.772 kg (145 lb)   12/21/13 65.772 kg (145 lb)     BMI (Estimated body mass index is 18.94 kg/(m^2) as calculated from the following:    Height as of this encounter: 1.854 m (6\' 1" ).    Weight as of this encounter: 65.1 kg (143 lb 8.3 oz).)  Temp Readings from Last 3 Encounters:   02/17/14 36.9 C (98.4 F)    12/22/13 35.8 C (96.4 F) Oral  BP Readings from Last 3 Encounters:   02/17/14 125/78   12/22/13 121/53     Pulse Readings from Last 3 Encounters:   02/17/14 74   12/22/13 69           Labs:   CBC:  Lab Results   Component Value Date    WBC 7.16 12/22/2013    HGB 9.4* 12/22/2013    HCT 28.7* 12/22/2013    PLT 194 12/22/2013       Chemistries:  Lab Results   Component Value Date    NA 132* 12/22/2013    K 4.1 12/22/2013    CL 101 12/22/2013    CO2 27 12/22/2013    BUN 8.0* 12/22/2013    CREAT 0.8 12/22/2013    GLU 123* 12/22/2013    CA 8.2* 12/22/2013       Coags:  Lab Results   Component Value Date    PT 15.0 12/21/2013    PTT 29 12/21/2013    INR 1.2* 12/21/2013     _____________________      Signed by: Florestine Avers  02/17/2014   2:54 PM    =============================================================

## 2014-02-18 ENCOUNTER — Encounter: Payer: Self-pay | Admitting: Surgery

## 2014-02-21 NOTE — H&P (Signed)
Patient was re-examined and there was no interval changes to his H and P

## 2015-02-08 ENCOUNTER — Encounter (INDEPENDENT_AMBULATORY_CARE_PROVIDER_SITE_OTHER): Payer: BC Managed Care – PPO | Admitting: Family

## 2018-01-26 ENCOUNTER — Emergency Department
Admission: EM | Admit: 2018-01-26 | Discharge: 2018-01-26 | Disposition: A | Discharge status: Home / Self Care | Payer: BC Managed Care – PPO | Attending: Nurse Practitioner | Admitting: Nurse Practitioner

## 2018-01-26 ENCOUNTER — Emergency Department: Payer: BC Managed Care – PPO

## 2018-01-26 DIAGNOSIS — W1830XA Fall on same level, unspecified, initial encounter: Secondary | ICD-10-CM | POA: Insufficient documentation

## 2018-01-26 DIAGNOSIS — S52502A Unspecified fracture of the lower end of left radius, initial encounter for closed fracture: Secondary | ICD-10-CM

## 2018-01-26 DIAGNOSIS — S52612A Displaced fracture of left ulna styloid process, initial encounter for closed fracture: Secondary | ICD-10-CM | POA: Insufficient documentation

## 2018-01-26 DIAGNOSIS — S52592A Other fractures of lower end of left radius, initial encounter for closed fracture: Secondary | ICD-10-CM | POA: Insufficient documentation

## 2018-01-26 DIAGNOSIS — Y9367 Activity, basketball: Secondary | ICD-10-CM | POA: Insufficient documentation

## 2018-01-26 MED ORDER — ETOMIDATE 2 MG/ML IV SOLN
INTRAVENOUS | Status: AC | PRN
Start: 2018-01-26 — End: 2018-01-26
  Administered 2018-01-26: 2 mg via INTRAVENOUS

## 2018-01-26 MED ORDER — IBUPROFEN 400 MG PO TABS
ORAL_TABLET | ORAL | Status: AC
Start: 2018-01-26 — End: ?
  Filled 2018-01-26: qty 2

## 2018-01-26 MED ORDER — ETOMIDATE 2 MG/ML IV SOLN
INTRAVENOUS | Status: AC | PRN
Start: 2018-01-26 — End: 2018-01-26
  Administered 2018-01-26: 4 mg via INTRAVENOUS

## 2018-01-26 MED ORDER — ETOMIDATE 2 MG/ML IV SOLN
INTRAVENOUS | Status: AC | PRN
Start: 2018-01-26 — End: 2018-01-26
  Administered 2018-01-26: 10 mg via INTRAVENOUS

## 2018-01-26 MED ORDER — VH HYDROMORPHONE HCL 1 MG/ML (NARRATOR)
INTRAMUSCULAR | Status: AC | PRN
Start: 2018-01-26 — End: 2018-01-26
  Administered 2018-01-26: 0.5 mg via INTRAVENOUS

## 2018-01-26 MED ORDER — HYDROCODONE-ACETAMINOPHEN 5-325 MG PO TABS
ORAL_TABLET | ORAL | Status: AC
Start: 2018-01-26 — End: ?
  Filled 2018-01-26: qty 1

## 2018-01-26 MED ORDER — HYDROCODONE-ACETAMINOPHEN 5-325 MG PO TABS
1.00 | ORAL_TABLET | Freq: Four times a day (QID) | ORAL | 0 refills | Status: AC | PRN
Start: 2018-01-26 — End: 2018-02-02

## 2018-01-26 MED ORDER — HYDROMORPHONE HCL 0.5 MG/0.5 ML IJ SOLN
INTRAMUSCULAR | Status: AC
Start: 2018-01-26 — End: ?
  Filled 2018-01-26: qty 0.5

## 2018-01-26 MED ORDER — HYDROCODONE-ACETAMINOPHEN 5-325 MG PO TABS
1.0000 | ORAL_TABLET | Freq: Once | ORAL | Status: AC
Start: 2018-01-26 — End: 2018-01-26
  Administered 2018-01-26: 1 via ORAL

## 2018-01-26 MED ORDER — IBUPROFEN 400 MG PO TABS
800.0000 mg | ORAL_TABLET | Freq: Once | ORAL | Status: AC
Start: 2018-01-26 — End: 2018-01-26
  Administered 2018-01-26: 800 mg via ORAL

## 2018-01-26 NOTE — ED Notes (Signed)
Bed: E51-A  Expected date:   Expected time:   Means of arrival:   Comments:  EMS

## 2018-01-26 NOTE — Op Note (Signed)
[facility]                Date: 01/26/2018  PREPROCEDURE DIAGNOSIS: Left distal radius fracture with ulnar  styloid fracture.    POSTPROCEDURE DIAGNOSIS: Left distal radius fracture with ulnar  styloid fracture.    PROCEDURE: Closed reduction of left distal radius fracture with  application of sugar-tong splint with sedation by ER.    HISTORY: Karl [name] is a 27 year old male who sustained a left  dorsally angulated distal radius fracture as the result of a  fall while playing basketball.  He has superficial abrasions  about the volar ulnar aspect of the wrist.  These are  superficial abrasions.  No deep wounds.    PROCEDURE IN DETAIL: The patient received conscious sedation.  The patient required a considerable amount of medication due to  a history of IV drug use.  Once he was sedated as possible,  closed reduction maneuver was performed.  Xeroform dressing was  applied over the volar abrasion and a 1 inch Kling.  This is  followed by placing the patient in a well-padded U splint, which  was molded to hopefully hold a better reduction.  Clinically,  patient's wrist appeared much straighter after closed reduction  maneuver.  He was neurologically intact, moving all fingers.  Sensation was intact to light touch.  Brisk cap refill.  Postreduction x-rays are pending.  He will need to follow up  with [facility] office this Wednesday for repeat x-rays and  to possibly schedule surgical fixation.    Alignment improved but still dorsal angulation, will need surgical fixation at later date    [id number]  DD: 01/26/2018 18:32:40  DT: 01/26/2018 20:42:47  JOB: [id number]

## 2018-01-26 NOTE — ED Triage Notes (Signed)
Pt presents for L wrist pain after falling on it while playing basketball earlier today. Deformity noted.

## 2018-01-26 NOTE — Consults (Signed)
Date: 01/26/2018  CONSULTING PHYSICIAN: [facility],  Dr. Rachel Moulds.[facility]                    CHIEF COMPLAINT: Left distal radius fracture.    HISTORY: Patient is a 27 year old male, who fell earlier this  afternoon while playing basketball in jail.  He presented to the  emergency room with a superficial abrasion to his left wrist  with left wrist deformity.  He reports no allergies to any  medications.  He reports no medical problems.    SOCIAL HISTORY: He reports no current alcohol, tobacco use or IV  drug use for the last 10 months.  He is seen and examined with  prison guards.  Denies any other complaints besides his left  wrist.    PAST SURGICAL HISTORY: Per the chart is significant for previous  reconstruction on the right upper extremity for laceration in  2015.    PHYSICAL EXAMINATION:  EXTREMITIES:  Left wrist is examined.  Patient has dorsal  angulation of the wrist.  He has superficial abrasions over the  volar ulnar aspect of the wrist in the wrist crease.  No deep  open wounds to suggest open fracture.  Again, these are  superficial abrasions.  Finger flexion and extension, thumb  flexion and extension are intact.  Sensation was intact to light  touch in the radial, ulnar, median nerve distribution.  He has  brisk cap refill.    RADIOGRAPHS: Demonstrated a comminuted dorsal angulated distal  radius fracture with ulnar styloid fracture.    IMPRESSION: Left distal radius fracture with superficial  abrasion.    PLAN: We will perform a closed reduction here in the emergency  room.  See procedure note.  Patient will need followup in the  orthopedic office this Wednesday.  Patient understands he will  likely need surgical fixation.  He understands the risks of  closed reduction including malunion, nonunion, injury to nerves  and vessels, pain, stiffness, loss of reduction, possible need  for surgical fixation.  The goal is to temporarily realign his  wrist.  Please  see procedure note.  Again, patient should ice  and elevate.  Nonweightbearing on his left upper extremity.  Encouraged finger motion.  We will need to follow up with [facility] office this Wednesday for repeat x-rays.        [id number]  DD: 01/26/2018 17:56:03  DT: 01/26/2018 18:07:22  JOB: [id number]

## 2018-01-26 NOTE — ED Provider Notes (Signed)
[facility]  EMERGENCY DEPARTMENT  History and Physical Exam       Patient Name: Karl [name], Karl [name]  Encounter Date:  01/26/2018  Treating Provider: Alona Bene, NP  Supervising Physician: Tessie Fass  PCP: Marisa Sprinkles, MD  Patient DOB:  [birth year]  MRN:  [id number]  Room:  E57/E57-A      History of Presenting Illness     Recorded chief complaint of  Wrist Pain      Karl [name] is a 27 y.o. male who presents with forearm pain.  He states he was in jail playing basketball and "was showing off " and he fell and landed on his left wrist behind his back.  He is having pain and deformity.  Pain is exacerbated by movement.  Nothing seems to relieve it.  It is in a sam splint upon arrival from EMS.  Denies any numbness.  There is an abrasion on his volar forearm.  He does not believe the bone went through his skin.       Review of Systems       Review of Systems   Musculoskeletal: Positive for joint swelling.   Skin: Positive for wound.       Review of systems is otherwise negative except as noted above.     Allergies & Medications     Pt has No Known Allergies.    Discharge Medication List as of 01/26/2018  6:28 PM           Past Medical History     Past Medical History:   Diagnosis Date   . Anemia    . Injury to blood vessels, unspecified site    . Open wound of forearm, with tendon involvement    . Pain in limb     Arm and Finger        Past Surgical History     Past Surgical History:   Procedure Laterality Date   . DEBRIDEMENT & IRRIGATION, UPPER EXTREMITY Right 02/17/2014    Procedure: DEBRIDEMENT & IRRIGATION, UPPER EXTREMITY;  Surgeon: Rosario Adie, MD;  Location: [facility] OR;  Service: Plastics;  Laterality: Right;   . REMOVAL, HARDWARE, SIMPLE, NON-SPINE Right 02/17/2014    Procedure: REMOVAL, HARDWARE, SIMPLE, NON-SPINE;  Surgeon: Rosario Adie, MD;  Location: [facility] OR;  Service: Plastics;  Laterality: Right;  RIGHT INDEX FINGER HARDWARE REMOVAL & DEBRIDEMENT   . REPAIR, HAND &  ARM, NERVE & TENDON Right 12/21/2013    Procedure: REPAIR, HAND & ARM, NERVE & TENDON;  Surgeon: Rosario Adie, MD;  Location: [facility] OR;  Service: Plastics;  Laterality: Right;  Right forearm/wrist/Lars Jeziorski and index finger traumatic lacerations explorations          Family History     No family history on file.       Social History     Social History     Social History   . Marital status: Single     Spouse name: N/A   . Number of children: N/A   . Years of education: N/A     Social History Main Topics   . Smoking status: Current Every Day Smoker     Packs/day: 0.50     Years: 6.00     Types: Cigarettes   . Smokeless tobacco: Never Used   . Alcohol use 14.4 oz/week     24 Cans of beer per week      Comment: 6 beers/d   . Drug  use: Yes     Types: Marijuana      Comment: Last Use 01/27/14   . Sexual activity: Not Asked     Other Topics Concern   . None     Social History Narrative   . None          Physical Exam     Blood pressure 133/79, pulse 93, temperature 97.9 F (36.6 C), temperature source Oral, resp. rate 15, height 1.854 m, weight 81.6 kg, SpO2 99 %.      Constitutional: WD/WN, active, mild distress.  Eyes: Pupils equal.  No conjunctivitis or discharge.  Visual acuity grossly intact.    HEENT:  NCAT. Ears: No external lesions.   Nose: No external lesions or discharge.    Oropharynx clear, moist mucous membranes.   Neck: Trachea midline. Normal ROM. No apparent masses.  Respiratory: Effort normal without accessory muscle use.   Cardiovascular: Cap refill WNL.  No significant peripheral edema.   Musculoskeletal: Left forearm with distal dorsal angulation, radial pulse 1+, abrasion on volar.  Left elbow with full range of motion nontender.  Fingers full range of motion nontender. Distal right fingers sensation intact.  Neurological: Pt is alert. Moving all extremities without apparent neurological deficit.   Psychiatric: Affect is appropriate. There is no agitation.   Skin: Skin is warm and dry.  No  cyanosis, jaundice or pallor. No rash, lesion as above.     Diagnostic Results     The results of the diagnostic studies below have been reviewed by myself:    Labs  Results     ** No results found for the last 24 hours. **            Radiologic Studies    Xr Wrist Left Pa And Lateral    Result Date: 01/26/2018  1.  Distal radius and ulna fractures are again noted. There is persistent lateral and posterior displacement of the large distal radial fragment. ReadingStation:[id number]    Xr Wrist Left 3+ Views    Result Date: 01/26/2018  Comminuted fracture of the distal radius as above. Mild positive ulnar variance likely due to TFCC tear, with mildly comminuted fracture of the ulnar styloid. Carpal bed is grossly intact. ReadingStation:[id number]               ED Course and Medical Decision Making     ED Medication Orders     Start Ordered     Status Ordering Provider    01/26/18 1819 01/26/18 1819  HYDROmorphone (DILAUDID) injection  Code/trauma/sedation medication      Last MAR action:  Given Tessie Fass    01/26/18 1816 01/26/18 1816  etomidate (AMIDATE) injection  Code/trauma/sedation medication     Route: Intravenous     Last MAR action:  Given Lennie Muckle  D    01/26/18 1815 01/26/18 1815  etomidate (AMIDATE) injection  Code/trauma/sedation medication     Route: Intravenous     Last MAR action:  Given Lennie Muckle  D    01/26/18 1815 01/26/18 1815  etomidate (AMIDATE) injection  Code/trauma/sedation medication     Route: Intravenous     Last MAR action:  Given Tessie Fass    01/26/18 1814 01/26/18 1814  etomidate (AMIDATE) injection  Code/trauma/sedation medication     Route: Intravenous     Last MAR action:  Given Tessie Fass    01/26/18 1812 01/26/18 1812  etomidate (AMIDATE) injection  Code/trauma/sedation medication  Route: Intravenous     Last MAR action:  Given Tessie Fass    01/26/18 1638 01/26/18 1637  HYDROcodone-acetaminophen (NORCO) 5-325 MG per tablet 1 tablet  Once in  ED     Route: Oral  Ordered Dose: 1 tablet     Last MAR action:  Given Ulysees Robarts D    01/26/18 1638 01/26/18 1637  ibuprofen (ADVIL,MOTRIN) tablet 800 mg  Once in ED     Route: Oral  Ordered Dose: 800 mg     Last MAR action:  Given Brysen Shankman D          MDM:    Differential diagnosis includes but is not limited to fracture, abrasion, open fracture, strain, contusion.  The patient has a significant distal radius fracture.  Dr. Toni Amend orthopedist on-call was called and he reviewed the films and advised that he needs reduction and is willing to assist with the reduction and splinting.               Imaging independently reviewed.   Prior records reviewed.        Procedures / Critical Care     PROCEDURAL SEDATION   By Dr. Cathlyn Parsons   Splinting done by [facility] assistance by Cleotis Nipper, NP as needed with  Toni Amend MD  6:10 PM      Sedation Physician: Dr. Rachel Moulds    Planned Sedation level: Deep Sedation/Analgesia   Procedure/Assisting Provider: Meriel Flavors MD    ASA Classification: ASA 1  Last PO intake assessed           Mallampati score: I (soft palate, uvula, fauces, and tonsillar pillars visible)  Other considerations: None      History, physical,and sedation plan done prior to performing sedation.     Patient's identity was confirmed  Patient condition reviewed prior to sedation by physician  Indication: orthopedic reduction  Sedative: etomidate      Patient was oxygenated with nasal canula.  Please see nursing sedation procedure notes for additional history, specific dosing, record of vitals during, and the timeline of the procedure.        Post Anesthesia Evaluation:  Patient able to participate in evaluation.  I have examined the patient and reviewed the chart for: respiratory function, cardiovascular function, mental status, temperature, pain, nausea/vomiting, and post-operative hydration to determine recovery from anesthesia and presence of complications:  Yes     Recovery from anesthesia  adequate: Yes  Complications: No    Duration of Sedation:  16-30 minutes  Time from first sedation medication given until the physician can safely leave bedside for patient to recover       Diagnosis / Disposition     Clinical Impression  1. Closed displaced fracture of styloid process of left ulna, initial encounter    2. Closed fracture of distal end of left radius, unspecified fracture morphology, initial encounter        Disposition  ED Disposition     ED Disposition Condition Date/Time Comment    Discharge  Sat Jan 26, 2018  7:07 PM Isaac Bliss discharge to home/self care.    Condition at disposition: Stable            Follow up for Discharged Patients  Honor Loh, MD  [address]  [place] [address]  [phone]    Schedule an appointment as soon as possible for a visit  For Wednesday to get surgery scheduled for next week.  Prescriptions for Discharged Patients  Discharge Medication List as of 01/26/2018  6:28 PM      START taking these medications    Details   HYDROcodone-acetaminophen (NORCO) 5-325 MG per tablet Take 1-2 tablets by mouth every 6 (six) hours as needed for Pain, Starting Sat 01/26/2018, Until Sat 02/02/2018, Print                    In addition to the above history, please see nursing notes. Allergies, meds, past medical, family, social hx, and the results of the diagnostic studies performed have been reviewed by myself.  This chart was generated by an EMR using voice recognition software and may contain errors or omissions not intended by the user.     Cleotis Nipper, NP  01/27/18 1326       Tessie Fass, MD  01/27/18 [phone]

## 2018-01-26 NOTE — Discharge Instructions (Signed)
Forearm Fracture That Needs to Be Set  You have a break or fracture of both bones in the forearm. The bones are out of place and must be set to make them straight again. This fracture usually takes 8 to 12 weeks to heal completely. Initial treatment is with a splint or cast. Severe injuries may need surgery to repair.    Home care   Keep your arm raisedto reduce pain and swelling. When sitting or lying down, raise your arm above heart level. You can do this by placing your arm on a pillow that rests on your chest or on a pillow at your side. This is most important during the first 48 hours after injury.   Apply an ice pack over the injured area for 15 to 20 minutes every 3 to 6 hours. You should do this for the first 24 to 48 hours. To make a cold pack, put ice cubes in a plastic bag that seals at the top. Wrap the bag in a clean, thin towel or cloth. Never put ice or an ice pack directly on the skin.As the ice melts, be careful that the cast or splint doesn't get wet. You can place the ice pack inside the sling and directly over the splint or cast. Keep using ice packs as needed to easepain and swelling.   Keep the cast or splint completely dry at all times. Bathe with your cast or splint out of the water. Protect it with 2 large plastic bags, one outside of the other, each taped with duct tape at the top end or use rubber bands. If a fiberglass splint or cast gets wet, you can dry it with a hair dryer on a cool setting.   You may use over-the-counter pain medicine to control pain, unless another pain medicine was prescribed. If you have chronic liver or kidney disease or ever had a stomach ulcer or GI bleeding, talk with your healthcare provider before using these medicines.  Follow-up care  Follow up with your healthcare provider, or as advised. If a splint was applied, it may be changed to a cast during your follow-up visit.  There is a chance that the fractures will move out of place again before the  ends begin to seal together. This often happens during the first week. Therefore, it is important that you follow-up as directed for another X-ray. If X-rays were taken, you will be told of any new findings that may affect your care.  When to seek medical advice  Call your healthcare provider right away if any of the following occur:   The plaster cast or splint becomes wet or soft   The fiberglass cast or splint stays wet for more than 24 hours   Increased tightness, looseness, or pain occurs under the cast or splint   Fingers become swollen, cold, blue, numb, or tingly   The cast or splintdevelops a bad odor  Date Last Reviewed: 09/24/2016   2000-2019 [employer] [address], [place] [address]. All rights reserved. This information is not intended as a substitute for professional medical care. Always follow your healthcare professional's instructions.

## 2018-02-01 ENCOUNTER — Ambulatory Visit: Payer: BC Managed Care – PPO | Attending: Orthopaedic Surgery

## 2018-02-04 NOTE — Pre-Procedure Instructions (Signed)
No stress test, echo or PFTs done in last 5 years.  Patient denies acute cardiopulmonary problems.

## 2018-02-05 ENCOUNTER — Ambulatory Visit: Payer: Self-pay | Admitting: Certified Registered"

## 2018-02-05 ENCOUNTER — Ambulatory Visit (HOSPITAL_BASED_OUTPATIENT_CLINIC_OR_DEPARTMENT_OTHER): Payer: Self-pay | Admitting: Certified Registered"

## 2018-02-05 ENCOUNTER — Ambulatory Visit: Payer: Self-pay

## 2018-02-05 ENCOUNTER — Ambulatory Visit
Admission: RE | Admit: 2018-02-05 | Discharge: 2018-02-05 | Disposition: A | Discharge status: Home / Self Care | Payer: Self-pay | Attending: Orthopaedic Surgery | Admitting: Orthopaedic Surgery

## 2018-02-05 ENCOUNTER — Encounter
Admission: RE | Disposition: A | Discharge status: Home / Self Care | Payer: Self-pay | Source: Home / Self Care | Attending: Orthopaedic Surgery

## 2018-02-05 ENCOUNTER — Encounter: Payer: Self-pay | Admitting: Certified Registered"

## 2018-02-05 DIAGNOSIS — X58XXXA Exposure to other specified factors, initial encounter: Secondary | ICD-10-CM | POA: Insufficient documentation

## 2018-02-05 DIAGNOSIS — S52502D Unspecified fracture of the lower end of left radius, subsequent encounter for closed fracture with routine healing: Secondary | ICD-10-CM

## 2018-02-05 DIAGNOSIS — S52502A Unspecified fracture of the lower end of left radius, initial encounter for closed fracture: Secondary | ICD-10-CM | POA: Diagnosis present

## 2018-02-05 DIAGNOSIS — S52612A Displaced fracture of left ulna styloid process, initial encounter for closed fracture: Secondary | ICD-10-CM | POA: Insufficient documentation

## 2018-02-05 DIAGNOSIS — Y9367 Activity, basketball: Secondary | ICD-10-CM | POA: Insufficient documentation

## 2018-02-05 DIAGNOSIS — S52602D Unspecified fracture of lower end of left ulna, subsequent encounter for closed fracture with routine healing: Secondary | ICD-10-CM

## 2018-02-05 HISTORY — PX: ORIF, RADIUS, DISTAL (WRIST): SHX510314

## 2018-02-05 SURGERY — ORIF, RADIUS, DISTAL (WRIST)
Anesthesia: Anesthesia General | Site: Wrist | Laterality: Left | Wound class: Clean

## 2018-02-05 MED ORDER — HYDROMORPHONE HCL 0.5 MG/0.5 ML IJ SOLN
0.5000 mg | INTRAMUSCULAR | Status: DC | PRN
Start: 2018-02-05 — End: 2018-02-06
  Administered 2018-02-05: 0.5 mg via INTRAVENOUS
  Filled 2018-02-05 (×2): qty 0.5

## 2018-02-05 MED ORDER — MIDAZOLAM HCL 2 MG/2ML IJ SOLN
INTRAMUSCULAR | Status: AC
Start: 2018-02-05 — End: ?
  Filled 2018-02-05: qty 2

## 2018-02-05 MED ORDER — HALOPERIDOL LACTATE 5 MG/ML IJ SOLN
1.0000 mg | Freq: Once | INTRAMUSCULAR | Status: DC | PRN
Start: 2018-02-05 — End: 2018-02-06

## 2018-02-05 MED ORDER — PROPOFOL 200 MG/20ML IV EMUL
INTRAVENOUS | Status: AC
Start: 2018-02-05 — End: ?
  Filled 2018-02-05: qty 20

## 2018-02-05 MED ORDER — DEXAMETHASONE SODIUM PHOSPHATE 4 MG/ML IJ SOLN
INTRAMUSCULAR | Status: DC | PRN
Start: 2018-02-05 — End: 2018-02-05
  Administered 2018-02-05: 8 mg via INTRAVENOUS

## 2018-02-05 MED ORDER — STERILE WATER FOR INJECTION IJ SOLN
2.0000 g | INTRAMUSCULAR | Status: AC
Start: 2018-02-05 — End: 2018-02-05
  Administered 2018-02-05: 14:00:00 2 g via INTRAVENOUS
  Filled 2018-02-05: qty 2000
  Filled 2018-02-05: qty 20

## 2018-02-05 MED ORDER — LACTATED RINGERS IV SOLN
INTRAVENOUS | Status: DC | PRN
Start: 2018-02-05 — End: 2018-02-05

## 2018-02-05 MED ORDER — MIDAZOLAM HCL 2 MG/2ML IJ SOLN
INTRAMUSCULAR | Status: DC | PRN
Start: 2018-02-05 — End: 2018-02-05
  Administered 2018-02-05: 2 mg via INTRAVENOUS

## 2018-02-05 MED ORDER — TRIPLE ANTIBIOTIC 3.5-400-5000 EX OINT
TOPICAL_OINTMENT | CUTANEOUS | Status: DC | PRN
Start: 2018-02-05 — End: 2018-02-05
  Administered 2018-02-05: 1 via TOPICAL

## 2018-02-05 MED ORDER — ONDANSETRON HCL 4 MG PO TABS
4.0000 mg | ORAL_TABLET | Freq: Three times a day (TID) | ORAL | 0 refills | Status: AC | PRN
Start: 2018-02-05 — End: ?

## 2018-02-05 MED ORDER — NALOXONE HCL 4 MG/0.1ML NA LIQD
NASAL | Status: AC
Start: 2018-02-05 — End: ?

## 2018-02-05 MED ORDER — MEPERIDINE HCL 25 MG/ML IJ SOLN
INTRAMUSCULAR | Status: AC
Start: 2018-02-05 — End: 2018-02-06
  Filled 2018-02-05: qty 1

## 2018-02-05 MED ORDER — CEPHALEXIN 250 MG PO CAPS
250.00 mg | ORAL_CAPSULE | Freq: Four times a day (QID) | ORAL | 0 refills | Status: AC
Start: 2018-02-05 — End: 2018-02-08

## 2018-02-05 MED ORDER — TRIPLE ANTIBIOTIC 3.5-400-5000 EX OINT
TOPICAL_OINTMENT | CUTANEOUS | Status: AC
Start: 2018-02-05 — End: ?
  Filled 2018-02-05: qty 1.87

## 2018-02-05 MED ORDER — OXYCODONE HCL 5 MG PO TABS
5.0000 mg | ORAL_TABLET | Freq: Once | ORAL | Status: AC | PRN
Start: 2018-02-05 — End: 2018-02-05
  Administered 2018-02-05: 5 mg via ORAL
  Filled 2018-02-05: qty 1

## 2018-02-05 MED ORDER — FENTANYL CITRATE (PF) 50 MCG/ML IJ SOLN (WRAP)
INTRAMUSCULAR | Status: DC | PRN
Start: 2018-02-05 — End: 2018-02-05
  Administered 2018-02-05 (×2): 50 ug via INTRAVENOUS

## 2018-02-05 MED ORDER — LIDOCAINE HCL (PF) 2 % IJ SOLN
INTRAMUSCULAR | Status: AC
Start: 2018-02-05 — End: ?
  Filled 2018-02-05: qty 5

## 2018-02-05 MED ORDER — ONDANSETRON HCL 4 MG/2ML IJ SOLN
INTRAMUSCULAR | Status: DC | PRN
Start: 2018-02-05 — End: 2018-02-05
  Administered 2018-02-05: 4 mg via INTRAVENOUS

## 2018-02-05 MED ORDER — HYDROCODONE-ACETAMINOPHEN 5-325 MG PO TABS
1.00 | ORAL_TABLET | Freq: Four times a day (QID) | ORAL | 0 refills | Status: AC | PRN
Start: 2018-02-05 — End: ?

## 2018-02-05 MED ORDER — ONDANSETRON HCL 4 MG/2ML IJ SOLN
4.0000 mg | Freq: Once | INTRAMUSCULAR | Status: DC | PRN
Start: 2018-02-05 — End: 2018-02-06

## 2018-02-05 MED ORDER — METOCLOPRAMIDE HCL 5 MG/ML IJ SOLN
10.0000 mg | Freq: Once | INTRAMUSCULAR | Status: DC | PRN
Start: 2018-02-05 — End: 2018-02-06

## 2018-02-05 MED ORDER — PROPOFOL 200 MG/20ML IV EMUL
INTRAVENOUS | Status: DC | PRN
Start: 2018-02-05 — End: 2018-02-05
  Administered 2018-02-05: 200 mg via INTRAVENOUS
  Administered 2018-02-05: 30 mg via INTRAVENOUS
  Administered 2018-02-05: 20 mg via INTRAVENOUS
  Administered 2018-02-05: 100 mg via INTRAVENOUS
  Administered 2018-02-05: 50 mg via INTRAVENOUS

## 2018-02-05 MED ORDER — ACETAMINOPHEN 10 MG/ML IV SOLN
INTRAVENOUS | Status: DC | PRN
Start: 2018-02-05 — End: 2018-02-05
  Administered 2018-02-05: 1000 mg via INTRAVENOUS

## 2018-02-05 MED ORDER — BUPIVACAINE HCL (PF) 0.25 % IJ SOLN
INTRAMUSCULAR | Status: AC
Start: 2018-02-05 — End: ?
  Filled 2018-02-05: qty 30

## 2018-02-05 MED ORDER — MEPERIDINE HCL 25 MG/ML IJ SOLN
12.5000 mg | Freq: Once | INTRAMUSCULAR | Status: AC | PRN
Start: 2018-02-05 — End: 2018-02-05
  Administered 2018-02-05: 12.5 mg via INTRAVENOUS

## 2018-02-05 MED ORDER — LIDOCAINE HCL (CARDIAC) 100 MG/5ML IV (WRAP)
PREFILLED_SYRINGE | INTRAVENOUS | Status: DC | PRN
Start: 2018-02-05 — End: 2018-02-05
  Administered 2018-02-05: 50 mg via INTRAVENOUS

## 2018-02-05 MED ORDER — BUPIVACAINE HCL (PF) 0.25 % IJ SOLN
INTRAMUSCULAR | Status: DC | PRN
Start: 2018-02-05 — End: 2018-02-05
  Administered 2018-02-05: 30 mL

## 2018-02-05 MED ORDER — FENTANYL CITRATE (PF) 50 MCG/ML IJ SOLN (WRAP)
INTRAMUSCULAR | Status: AC
Start: 2018-02-05 — End: ?
  Filled 2018-02-05: qty 2

## 2018-02-05 MED ORDER — FENTANYL CITRATE (PF) 50 MCG/ML IJ SOLN (WRAP)
25.0000 ug | INTRAMUSCULAR | Status: AC | PRN
Start: 2018-02-05 — End: 2018-02-05
  Administered 2018-02-05 (×4): 25 ug via INTRAVENOUS
  Filled 2018-02-05: qty 2

## 2018-02-05 SURGICAL SUPPLY — 39 items
BANDAGE ESMARK 4IN  STERILE LF (Dressings) ×2 IMPLANT
BANDAGE SOF BAND KERLIX 4.5 IN (Dressings) ×2 IMPLANT
BIT DRILL 1.8MM (Supply) ×1 IMPLANT
BIT DRILL 2.0MM (Supply) ×1 IMPLANT
CORD DISPOSABLE BOVIE (Supply) ×2 IMPLANT
COVER LITE HANDLE DISP (Supply) ×6 IMPLANT
CUFF TOURNIQUET 24 (Supply) ×2 IMPLANT
DRAPE ANTIMICROBIAL IOBAN (Supply) ×2 IMPLANT
DRAPE C-ARM X-RAY (Supply) ×2 IMPLANT
DRSG XEROFORM 1 X 8 (Dressings) ×1 IMPLANT
FILTER NEPTUNE 4 PORT (Supply) ×1 IMPLANT
GLOVE BIOGEL UND PI IND SZ 7.5 (Supply) ×2 IMPLANT
GLOVE SURGEON SYN PF SZ 7.5 (Supply) ×2 IMPLANT
GOWN AURORA NONREINF STER LG (Supply) ×6 IMPLANT
GOWN REINFORCE XXL XLONG (Supply) ×2 IMPLANT
PACK EXTREMITY-LF (Supply) ×2 IMPLANT
PAD ARMBOARD 20 X 8 (Supply) ×2 IMPLANT
PLT 2.4 VALCP 6H HD 3H SHFT LT ×1 IMPLANT
SCREW 2.4 VA LOCK STARDRV 16MM ×1 IMPLANT
SCREW 2.4 VA LOCK STARDRV 18MM ×3 IMPLANT
SCREW 2.4 VA LOCK STARDRV 20MM ×1 IMPLANT
SCREW CORTEX SELFTAP 2.7 X 14 ×1 IMPLANT
SCREW CORTEX SELFTAP 2.7 X 16 ×2 IMPLANT
SCREW CRTX S/TAP RECESS 2.4X18 ×1 IMPLANT
SCREW CRTX S/TAP RECESS 2.4X24 ×1 IMPLANT
SLEEVE ARM DISP SINGLE-USE (Supply) ×2 IMPLANT
SLING ARM LARGE (Supply) ×1 IMPLANT
SOL SALINE IRRIG 500ML (Solutions) ×3 IMPLANT
SPLINT PLASTER 3 (Supply) ×6 IMPLANT
STAPLER SKIN DISP35WIDE#PXW35 (Supply) IMPLANT
SUT ETHILON 3-0 1669H (Supply) IMPLANT
SUT ETHILON 4-0 1667G (Supply) IMPLANT
SUT VICRYL 0 J534H (Supply) ×2 IMPLANT
SUT VICRYL COATED 2-0 J869H (Supply) ×2 IMPLANT
SYRINGE 12CC CONTROL LL (Supply) ×1 IMPLANT
TOWEL BLUE STERILE 6 PER PK (Supply) ×4 IMPLANT
UNDERPAD BLUE 23X36  LF (Supply) ×2 IMPLANT
WIRE C BLACK .062 ×1 IMPLANT
WIRE C GREEN .45 ×1 IMPLANT

## 2018-02-05 NOTE — Progress Note - Problem Oriented Charting Notewrit (Signed)
Spoke with Dr Perlie Gold per Dr Fredric Mare re pain uncontrolled still.  He has ordered 1 mg dilaudid IV for pain.

## 2018-02-05 NOTE — Discharge Summary (Signed)
ADMITTING DIAGNOSIS: Comminuted displaced left distal radius  fracture.    DISCHARGE DIAGNOSIS: Comminuted displaced left distal radius  fracture.    PROCEDURE: Open reduction and internal fixation, left comminuted  distal radius fracture.    CLINICAL COURSE: [name] underwent outpatient surgery in the  main OR on February 05, 2018.  He was to be discharged home when  comfortable.  [facility].  He was to ice and elevate.  Finger motion is  encouraged.  Nonweightbearing on his left upper extremity.  His  prescriptions were already given to the officers accompanying  the patient.  These were Norco 5/325, 1 to 2 tabs every 6 hours  as needed for pain, Zofran for nausea, Keflex 250 mg p.o. q.i.d.  for 3 days of antibiotics and a script for Narcan nasal spray.  He will keep his dressing clean and dry until seen in followup.  Encouraged finger motion, non-weight his left upper extremity.  He will follow up in two weeks postop at the [facility]  office.        [id number]  DD: 02/05/2018 16:11:35  DT: 02/05/2018 16:19:22  JOB: [id number]

## 2018-02-05 NOTE — Brief Op Note (Signed)
BRIEF OP NOTE    Date Time: 02/05/18 4:02 PM    Patient Name:   Karl [name]    Date of Operation:   02/05/2018    Providers Performing:   Surgeon(s):  Honor Loh, MD    Assistant (s):   Circulator: Dennie Maizes, RN  Radiology Technologist: Aniceto Boss  Scrub Person: Erasmo Leventhal  First Assistant: Assunta Gambles, NP    Operative Procedure:   Procedure(s):  ORIF, RADIUS, DISTAL (WRIST) - Wound Class: Clean     Preoperative Diagnosis:   Pre-Op Diagnosis Codes:     * Closed fracture distal radius and ulna, left, with routine healing, subsequent encounter [S52.502D, S52.602D]     * Closed fracture of distal end of left radius with ulna with routine healing [S52.502D, S52.602D]    Postoperative Diagnosis:   Post-Op Diagnosis Codes:     * Closed fracture distal radius and ulna, left, with routine healing, subsequent encounter [S52.502D, S52.602D]     * Closed fracture of distal end of left radius with ulna with routine healing [S52.502D, S52.602D]    Anesthesia:   General    Estimated Blood Loss:    50 mL tt: 67 minutes    Implants:     Implant Name Type Inv. Item Serial No. Manufacturer Lot No. LRB No. Used Action   PLT 2.4 VALCP 6H HD 3H SHFT LT - [id number]  PLT 2.4 VALCP 6H HD 3H SHFT LT  [employer]  Left 1 Implanted   SCREW 2.4 [place] LOCK STARDRV - [id number]  SCREW 2.4 [place] LOCK STARDRV  [employer]  Left 1 Implanted   SCREW 2.4 [place] LOCK STARDRV - [id number]  SCREW 2.4 [place] LOCK STARDRV  [employer]  Left 3 Implanted   SCREW 2.4 [place] LOCK STARDRV - [id number]  SCREW 2.4 [place] LOCK STARDRV  [employer]  Left 1 Implanted   SCREW CORTEX SELFTAP 2.7 X 16 - [id number]  SCREW CORTEX SELFTAP 2.7 X 16  [employer]  Left 2 Implanted   SCREW CORTEX SELFTAP 2.7 X 14 - [id number]   SCREW CORTEX SELFTAP 2.7 X 14   [employer]ES   Left 1 Implanted       Drains:   Drains: no    Specimens:    none    OR Specimens:   * No specimens in log *    Findings:   Comminuted distal radius fx with ulna styloid fx      Complications:   none      Signed by: Honor Loh, MD                                                                           [facility]

## 2018-02-05 NOTE — Progress Note - Problem Oriented Charting Notewrit (Signed)
Pain 8/10 upon arrival to [facility].  Repositioned and ice on pt's arm at this time.  Will recheck if pain medications have started working better.

## 2018-02-05 NOTE — Anesthesia Postprocedure Evaluation (Signed)
Anesthesia Post Evaluation    Patient: Karl [name]    Procedures performed: Procedure(s) with comments:  ORIF, RADIUS, DISTAL (WRIST) - ORIF LEFT WRIST FRACTURE    Anesthesia type: General ETT    Patient location:PACU    Last vitals:   Vitals:    02/05/18 1702   BP: (!) 138/100   Pulse: 98   Resp: 18   Temp:    SpO2:        Post pain: Patient not complaining of pain, continue current therapy      Mental Status:awake and alert     Respiratory Function: tolerating room air    Cardiovascular: stable    Nausea/Vomiting: patient not complaining of nausea or vomiting    Hydration Status: adequate    Post assessment: no apparent anesthetic complications, no reportable events and no evidence of recall

## 2018-02-05 NOTE — Anesthesia Preprocedure Evaluation (Signed)
Anesthesia Evaluation    AIRWAY    Mallampati: I    TM distance: >3 FB  Neck ROM: full  Mouth Opening:full  Planned to use difficult airway equipment: No CARDIOVASCULAR    cardiovascular exam normal       DENTAL    no notable dental hx     PULMONARY    pulmonary exam normal     OTHER FINDINGS                Smoker            Anesthesia Plan    ASA 2     general                     intravenous induction           Post op pain management: per surgeon    informed consent obtained                   Signed by: Carmelina Paddock 02/05/18 12:59 PM

## 2018-02-05 NOTE — H&P (Signed)
I have reviewed the H&P, examined the patient and there are no changes.

## 2018-02-05 NOTE — Transfer of Care (Signed)
Anesthesia Transfer of Care Note    Patient: Karl [name]    Last vitals:   Vitals:    02/05/18 1616   BP: 123/53   Pulse: 88   Resp: 12   Temp: 36.1 C (97 F)   SpO2: 96%       Oxygen: Nasal Cannula     Mental Status:sedated    Airway: Oral Airway    Cardiovascular Status:  stable

## 2018-02-05 NOTE — Discharge Instructions (Signed)
Discharge Instructions: After Your Surgery  You've just had surgery. During surgery, you were given medicine called anesthesia to keep you relaxed and free of pain. After surgery, you may have some pain or nausea. This is common. Here are some tips for feeling better and getting well after surgery.     Stay on schedule with your medicine.   Going home  Your healthcare provider will show you how to take care of yourself when you go home. He or she will also answer your questions. Have an adult family member or friend drive you home. For the first 24 hours after your surgery:   Don't drive or use heavy equipment.   Don't make important decisions or sign legal papers.   Don't drink alcohol.   Have someone stay with you, if needed. He or she can watch for problems and help keep you safe.  Be sure to go to all follow-up visits with your healthcare provider. And rest after your surgery for as long as your healthcare provider tells you to.  Coping with pain  If you have pain after surgery, pain medicine will help you feel better. Take it as told, before pain becomes severe. Also, ask your healthcare provider or pharmacist about other ways to control pain. This might be with heat, ice, or relaxation. And follow any other instructions your surgeon or nurse gives you.  Tips for taking pain medicine  To get the best relief possible, remember these points:   Pain medicines can upset your stomach. Taking them with a little food may help.   Most pain relievers taken by mouth need at least 20 to 30 minutes to start to work.   Don't wait till your pain becomes severe before you take your medicine. Try to time your medicine so that you can take it before starting an activity. This might be before you get dressed, go for a walk, or sit down for dinner.   Constipation is a common side effect of pain medicines. Call your healthcare provider before taking any medicines such as laxatives or stool softeners to help ease  constipation. Also ask if you should skip any foods. Drinkinglots of fluids andeating foodssuch as fruits and vegetables that are high in fiber can also help. Remember, don't take laxatives unless your surgeon has prescribed them.   Drinking alcohol and taking pain medicine can cause dizziness and slow your breathing. It can even be deadly. Don't drink alcohol while taking pain medicine.   Pain medicine can make you react more slowly to things. Don't drive or run machinery while taking pain medicine.  Your healthcare providermay tell you to take acetaminophen to help ease your pain. Ask him or her how much you are supposed to take each day. Acetaminophen or other pain relievers may interact with your prescription medicines or other over-the-counter (OTC) medicines. Some prescription medicines have acetaminophen and other ingredients.Using both prescription and OTC acetaminophenfor paincan cause you to overdose. Readthe labels on your OTC medicineswith care. This will help youto clearly know the list of ingredients, how much to take, and anywarnings. It may also help you not take too muchacetaminophen.If you have questions or don't understand the information, ask your pharmacist or healthcare provider to explain it to you before you take the OTC medicine.  Managing nausea  Some people have an upset stomach after surgery. This is often because of anesthesia, pain, or pain medicine, or the stress of surgery. These tips will help you handle nausea and eat   healthy foods as you get better. If you were on a special food plan before surgery, ask your healthcare provider if you should follow it while you get better. These tips may help:   Don't push yourself to eat. Your body will tell you when to eat and how much.   Start off with clear liquids and soup. They are easier to digest.   Next try semi-solid foods, such as mashed potatoes, applesauce, and gelatin, as you feel ready.   Slowly move to solid  foods. Don't eat fatty, rich, or spicy foods at first.   Don't force yourself to have 3 large meals a day. Instead eat smaller amounts more often.   Take pain medicines with a small amount of solid food, such as crackers or toast, to prevent nausea.  When to call your healthcare provider  Call your healthcare provider if:   You still have intolerable pain an hour after taking medicine. The medicine may not be strong enough.   You feel too sleepy, dizzy, or groggy. The medicine may be too strong.   You have side effects such as nausea or vomiting, or skin changes such as rash, itching, or hives.Your healthcare provider may suggest other medicines to control side effects.  Rash, itching, or hives may mean you have an allergic reaction. Report this right away. If you have trouble breathing or facial swelling, call 911 right away.  If you have obstructive sleep apnea  You were given anesthesia medicine during surgery to keep you comfortable and free of pain. After surgery, you may have more apnea spells because of this medicine and other medicines you were given. The spells may last longer than usual.  At home:   Keep using the continuous positive airway pressure (CPAP) device when you sleep. Unless your healthcare provider tells you not to, use it when you sleep, day or night. CPAP is a common device used to treat obstructive sleep apnea.   Talk with your provider before taking any pain medicine, muscle relaxants, or sedatives. Your provider will tell you about the possible dangers of taking these medicines.  Date Last Reviewed: 08/24/2017   2000-2019 [employer] [address], [place] [address]. All rights reserved. This information is not intended as a substitute for professional medical care. Always follow your healthcare professional's instructions.

## 2018-02-05 NOTE — Discharge Instr - AVS First Page (Signed)
Ice, elevated left upper extremity  Keep splint clean and dry until follow up, do not remove  Encourage finger motion  No lifting with left upper extremity, sling to help elevate

## 2018-02-05 NOTE — Progress Note - Problem Oriented Charting Notewrit (Signed)
PAIN  Pt continuing to have pain 8/10 will call Dr Fredric Mare as previous page has not responded.

## 2018-02-05 NOTE — Op Note (Signed)
Date: 02/05/2018  PREOPERATIVE DIAGNOSIS: Comminuted left displaced distal radius  fracture at least three parts with ulnar styloid fracture.    POSTOPERATIVE DIAGNOSIS: Comminuted left displaced distal radius  fracture at least three parts with ulnar styloid fracture.    PROCEDURE: Open reduction and internal fixation of comminuted  left distal radius fracture encompassing three primary parts  with Synthes variable angle left distal radius locking plate  with nonoperative management of ulnar styloid fractures.    OPERATIVE ATTENDING: Honor Loh, MD    ASSISTANT: Zoe Lan, NP    ANESTHESIA: LMA general anesthesia.    ESTIMATED BLOOD LOSS: 50 cc.    TOURNIQUET TIME: 67 minutes.    DRAINS: None.    SPECIMENS: None.    COMPLICATIONS: None.    HISTORY: [name] is a 27 year old male, who sustained a  comminuted left distal radius fracture as a result of a fall  playing basketball.  I have recommended surgical fixation.  On  the day of surgery, he was seen in preop holding area.  Operative site marked.  Consents were confirmed.    PROCEDURE IN DETAIL: He was subsequently taken to the operative  suite where he was placed supine on the operative table with a  hand table extension.  He underwent LMA general anesthesia.  He  had a tourniquet applied to his left proximal arm.  The  patient's previous splint was removed.  The patient had early  healing of superficial abrasions about the volar ulnar aspect of  his wrist.  No deep wounds.  No signs of infection.  Left upper  extremity was prepped and draped in routine sterile surgical  fashion with Hibiclens scrub and ChloraPrep paint.  After  prepping and draping and pause for safety, arm was exsanguinated  an Esmarch and tourniquet was inflated to 250 mmHg.  A volar  approach for distal radius was then performed.  Incision was  made with #15 blade.  Dissection was carried down just through  the skin.  Small tenotomy scissors were then used for further  deep  dissection.  The flexor carpi radialis tendon was  identified and the fascia over it was incised.  The interval was  then explored between the flexor carpi radialis tendon and the  radial artery.  Deep dissection was carried down and the pronator quadratus was identified and it was sharply released with small  cuff of tissue about the radial aspect of the distal radius.  The pronator quadratus was then retracted ulnarly.  The patient  was found to have a comminuted fracture involving the distal  radius.  Attempts were made to try to reduce it out to length,  which was difficult.  This required release of the  brachioradialis.  Once the brachioradialis was released, I was  able to get a Therapist, nutritional up underneath the primary fracture  fragment and get the radius reduced back up to near anatomic  alignment.  The radius was anatomically aligned around the ulnar  aspect due to some comminution radially.  There was some minimal  approximately 1 mm step-off.  The fracture was provisionally  pinned with a 0.062 K-wire and brought into the radial styloid.  Fluoroscopic images were obtained demonstrating the radius to be  back out to length and correction of normal volar tilt.  At this  point, a Synthes variable angle distal radius plate left was  placed in the wound was provisionally fixed to the radial shaft  and then adjusted under fluoroscopic  imaging to be properly  position.  A bicortical screw was then placed in the most distal  ulnar hole into the distal radius fracture fragment to help  reduce the fracture down to the plate and create anatomic  alignment.  I then proceeded to place three additional locking  screws into the distal radius fracture fragment unicortically.  Fluoroscopic images was obtained after each screw was placed.  The ulnar most bicortical screw was then removed and exchanged  for an unicortical locking screw.  An additional radial sided  screw was placed up into the radial styloid.  I then  proceeded  to place two 2.7 mm bicortical screws into the proximal radial  shaft and exchanged initial 2.4 mm screw through a newly drilled  2.7 mm bicortical screw.  Final fluoroscopic images were  obtained, AP and lateral views as well as obliques, which  demonstrated essentially anatomic alignment of the fracture with  recreation of normal volar tilt and radial length.  Care was to  making sure that all distal screws were appropriate length and  did not penetrate the dorsal cortex and did not penetrate the  distal radioulnar joint or the distal radial carpal joint.  Live  fluoroscopic images were obtained taking the wrist through full  range of motion demonstrating the wrist to be stable.  The ulnar  styloid fracture fragment appeared to be anatomically aligned.  At this point, the wound was irrigated with copious amounts of  bulb syringe irrigation.  Pronator quadratus was repaired with  3-0 Vicryl suture.  Tourniquet was released at a total time of  67 minutes.  Hemostasis was obtained.  The wound was again  re-irrigated and then skin was closed with 3-0 Vicryl sutures  for the subcutaneous layer followed by 4-0 simple nylon sutures  for the skin.  Incision was infiltrated with 0.25% Sensorcaine  plain for postoperative pain relief.  Sterile dressings were  applied.    Please note, back in the body part of the dictation that the  radial styloid pin was removed after placing the initial four  screws into the distal radius fracture fragments.    Again, sterile dressings were applied.  Patient was placed in a  well-padded volar splint.  LMA was removed and patient was taken  to PACU in stable condition.  Counts correct.  No specimens.  Please note, the superficial abrasions ulnarly were dressed with  bacitracin ointment and Xeroform prior to placement of the  splint.        [id number]  DD: 02/05/2018 16:05:02  DT: 02/05/2018 16:34:54  JOB: [id number]

## 2018-02-07 ENCOUNTER — Encounter: Payer: Self-pay | Admitting: Orthopaedic Surgery

## 2018-02-12 ENCOUNTER — Emergency Department: Payer: BC Managed Care – PPO

## 2018-02-12 ENCOUNTER — Emergency Department
Admission: EM | Admit: 2018-02-12 | Discharge: 2018-02-12 | Disposition: A | Discharge status: Home / Self Care | Payer: BC Managed Care – PPO | Attending: Emergency Medicine | Admitting: Emergency Medicine

## 2018-02-12 DIAGNOSIS — M79632 Pain in left forearm: Secondary | ICD-10-CM | POA: Insufficient documentation

## 2018-02-12 DIAGNOSIS — Z8781 Personal history of (healed) traumatic fracture: Secondary | ICD-10-CM

## 2018-02-12 DIAGNOSIS — M7989 Other specified soft tissue disorders: Secondary | ICD-10-CM | POA: Insufficient documentation

## 2018-02-12 MED ORDER — SULFAMETHOXAZOLE-TRIMETHOPRIM 800-160 MG PO TABS
1.00 | ORAL_TABLET | Freq: Two times a day (BID) | ORAL | 0 refills | Status: AC
Start: 2018-02-12 — End: 2018-02-19

## 2018-02-12 MED ORDER — CEPHALEXIN 500 MG PO CAPS
ORAL_CAPSULE | ORAL | Status: AC
Start: 2018-02-12 — End: ?
  Filled 2018-02-12: qty 1

## 2018-02-12 MED ORDER — CEPHALEXIN 500 MG PO CAPS
500.00 mg | ORAL_CAPSULE | Freq: Once | ORAL | Status: AC
Start: 2018-02-12 — End: 2018-02-12
  Administered 2018-02-12: 14:00:00 500 mg via ORAL

## 2018-02-12 MED ORDER — SULFAMETHOXAZOLE-TRIMETHOPRIM 800-160 MG PO TABS
1.00 | ORAL_TABLET | Freq: Once | ORAL | Status: AC
Start: 2018-02-12 — End: 2018-02-12
  Administered 2018-02-12: 14:00:00 1 via ORAL

## 2018-02-12 MED ORDER — SULFAMETHOXAZOLE-TRIMETHOPRIM 800-160 MG PO TABS
ORAL_TABLET | ORAL | Status: AC
Start: 2018-02-12 — End: ?
  Filled 2018-02-12: qty 1

## 2018-02-12 MED ORDER — CEPHALEXIN 500 MG PO CAPS
500.00 mg | ORAL_CAPSULE | Freq: Four times a day (QID) | ORAL | 0 refills | Status: AC
Start: 2018-02-12 — End: 2018-02-19

## 2018-02-12 NOTE — Discharge Instructions (Signed)
Managing Post-Op Pain at Home  Pain is expected after surgery. Know that you have a right to have this pain controlled. Managing pain helps you recover faster. Less pain means you can be active sooner. It also means less stress on the body and mind, which will help your body heal. When you go home after surgery, you will take charge of your pain management.  What is post-op pain?  Pain after surgery (post-op pain) is normal. How much pain you feel depends on the surgery. Your use of pain medicines and your sensitivity to pain are also factors. Each person feels pain differently. So try not to compare your pain with someone else's. Your healthcare team will need to know how you are feeling. Be honest. If you are in pain, say so.  Measuring your pain  A pain scale helps you rate pain intensity. In the scale, 0 means no pain, and 10 is the worst pain possible. Pain scales are not used to compare your pain with another person's pain. A pain scale is used only to measure how your pain changes for you.You should rate yourpain every few hours. You may feel some pain even with medicines.It is important totell your healthcare provider if medicines don't reduce the pain. Be sure to mention if the pain suddenly increases or changes.    Your recovery  Your first hours at home, you may feel groggy or tired from the medicines given during surgery. As pain management methods used during surgery wear off, pain may increase. So be sure not to skip a dose of prescribed medicine. In fact, set an alarm or have someone remind you when it's time to take your medicine. During this time, try to rest, even if you feel pretty good. Within the first 24 hours, a nurse or other healthcare provider is likely to call and ask how you're doing.  Medicines for pain  Medicines can help to block pain, limit swelling and control related problems. You may be given more thanone medicine to treat your pain. Medicines may be changed as you feel  better, or if they cause side effects.  Pain medicine can be given in several ways: by injection, by mouth, as a patch, or as a suppository.  Medicines What they do Possible side effects   Non-steroidal anti-inflammatory drugs (NSAIDs) Reduce mild to moderate pain. They also help reduce swelling. Nausea, stomach and digestive problems, and kidney and liver problems. Certain NSAIDs may increase the risk for cardiovascular disease in some people.   Opioids (morphine and similar medicines often called narcotics) Reduce moderate to severe pain Nausea, vomiting, itching,drowsiness, constipation, slowed or shallow breathing   Other pain relievers (analgesics) Reduce mild to severe pain Constipation, nausea, dizziness, drowsiness, kidney and liver problems   Anti-vomiting medicine (antiemetics) Manage nausea Dizziness, drowsiness, muscle spasms   Seizure medicines (anticonvulsants) Manage nerve-related pain Drowsiness, dizziness, liver problems   Medicines for depression (antidepressants) Manage chronic pain Dry mouth, drowsiness, dizziness, constipation   Non-medicine pain relief  Medicines are not the only way to manage pain after surgery.  You can use ice to help reduce swelling and pain. Use a cold pack or bag of ice cubes wrapped in a thin cloth. Never put ice directly on your skin. Use the ice for up to 20 minutes at a time every 3 to 4 hours.  You can also reduce swelling and pain by keeping the operated area above the level of your heart if you can. This helps blood   and other fluids drain from the area.  You can also use relaxation to reduce pain. Try these techniques:   Visualization or guided imagery. You picture yourself in a quiet, peaceful place to help take your mind off the pain.   Progressive body relaxation. Starting at your feet, you clench and release your muscles. Work up your body slowly until you reach your neck and face.   Deep breathing. Breathe in deeply and hold your breath for a few  seconds. Then exhale slowly to help relax your body.  Tips for controlling pain   Give pain medicine time to work. Most pain relievers taken by mouth need at least 20 to 30 minutes to take effect. They may not reach their maximum effect for close to an hour.   Take pain medicine at regular times as directed. Don't wait until the pain gets bad to take it.   Get plenty of rest. Taking your medicine at night may help you get a good night's rest.   If pain lessens, try taking your medicine less often or in smaller doses.  Safety tips for taking pain medicines   Ask your pharmacist if you need to take the medicine with food or milk to avoid an upset stomach.   Don't break, crush, or cut in half any [place]-acting medicines. This could be harmful.   Don't take more medicine than directed. If your pain isn't relieved, call your healthcare provider.   Try to time your medicine so that you take it before starting an activity, such as dressing or sitting at the table for dinner.   Constipation is a common side effect with some pain medicines. Eating fruit, vegetables, andother foods high in fibercan help. Also drink plenty of fluids.   Don't drink alcohol while you are taking pain medicine. This can make you dizzy and slow your breathing. It can even be fatal.   Don't drive if you are taking opioid medicines.   Don't take opioids in combination with benzodiazepines. Doing so can cause serious health problems. These include extreme sleepiness, slowed breathing, and death. Let your healthcare provider know if you are taking benzodiazepines.    When to call your healthcare provider  Call your healthcare provider right away if:   Your pain is not relieved or if it gets worse   You can't take your pain medicines as prescribed   You have severe side effects like breathing problems, trouble waking up, dizziness, confusion, or severe constipation   Date Last Reviewed: 05/26/2016   2000-2019 [employer]  [address], [place] [address]. All rights reserved. This information is not intended as a substitute for professional medical care. Always follow your healthcare professional's instructions.

## 2018-02-12 NOTE — ED Notes (Signed)
NP at bedside. Updating pt on results and plan of care at this time.

## 2018-02-12 NOTE — ED Notes (Signed)
Dr. Toni Amend of ortho surgery at bedside assessing pt at this time.

## 2018-02-12 NOTE — Consults (Signed)
Date: 02/12/2018  HISTORY: Karl [name] recently underwent open reduction and  internal fixation of a comminuted left distal radius fracture on  February 05, 2018.  He was brought back into the emergency room  today because he reported some increasing pain in his forearm  and some redness.  Patient was seen and examined with the ER  physician assistant.  His left wrist incision sites well healed.  There is no drainage.  The previous abrasion is also well healed  about the volar aspect of the ulnar forearm.  Finger flexion and  extension, thumb flexion and extension are intact.  Sensation  was intact to light touch.  Brisk cap refill.  He has some mild  swelling of his proximal forearm.  He had one small area of  redness about the proximal forearm which actually looks more  like proximal bruising or rubbing from his splint.  Again, the  incision line is nice and intact.  No fluctuance and no  drainage.  No significant erythema about the surgical site.    IMPRESSION: Status post open reduction and internal fixation,  left distal radius fracture on February 05, 2018.  Overall, the  forearm looks benign.  I think it is reasonable to go ahead and  put him on dose of Keflex and Bactrim with follow up as  regularly scheduled postoperative.  He is to return if  increasing pain or if he develops fevers.  He is currently  afebrile.  Again, overall the arm looks benign.  He will  continue with the splint, nonweightbearing on his left upper  extremity.  Again, I agree with one week supply of the  antibiotics, especially given the fact he is currently in  prison.  Keflex and Bactrim would be appropriate.    Addendum:  Left wrist xray with hardware in good position        [id number]  DD: 02/12/2018 12:40:35  DT: 02/12/2018 13:03:50  JOB: [id number]

## 2018-02-12 NOTE — ED Triage Notes (Signed)
Pt presents to the ED via triage from [facility]. Sent for increased pain, swelling and redness of surgical site post ORIF of left forearm completed on 02/05/18. Pt is A&Ox4 and answering questions appropriately, RR even and unlabored. Pt denies known fevers since surgery, as well as any nausea/vomitting. Swelling and erythema noted to left inner forearm tracking up to left inner elbow.

## 2018-02-12 NOTE — Consults (Signed)
Ortho    Pt seen and examined  Sp ORIF Left distal radius fx on 02/05/18  Incision site is cdi,   Small area of redness proximal forearm.  Looks more consistent with bruising. No fluctuance. Compartments soft  Ff/fe/tf/te intact  Sensation intact to light touch  Brisk cap refill    A/P  Overall benign appearing forearm. Incision site clean and dry  reappy splint, NWB LUE  Reasonable to cover with bactrim and keflex for 7 days given patient is in a prison facility  Follow up in office for postop visit  Discussed with ER PA
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
## 2019-10-24 ENCOUNTER — Encounter (HOSPITAL_COMMUNITY): Payer: Self-pay | Admitting: Emergency Medicine

## 2019-10-24 ENCOUNTER — Emergency Department (HOSPITAL_COMMUNITY): Payer: Medicaid - Out of State

## 2019-10-24 ENCOUNTER — Other Ambulatory Visit: Payer: Self-pay

## 2019-10-24 ENCOUNTER — Emergency Department (HOSPITAL_COMMUNITY)
Admission: EM | Admit: 2019-10-24 | Discharge: 2019-10-25 | Disposition: A | Discharge status: Home / Self Care | Payer: Medicaid - Out of State | Attending: Emergency Medicine | Admitting: Emergency Medicine

## 2019-10-24 DIAGNOSIS — T50991A Poisoning by other drugs, medicaments and biological substances, accidental (unintentional), initial encounter: Secondary | ICD-10-CM | POA: Diagnosis not present

## 2019-10-24 DIAGNOSIS — T50994A Poisoning by other drugs, medicaments and biological substances, undetermined, initial encounter: Secondary | ICD-10-CM | POA: Diagnosis present

## 2019-10-24 DIAGNOSIS — T50901A Poisoning by unspecified drugs, medicaments and biological substances, accidental (unintentional), initial encounter: Secondary | ICD-10-CM

## 2019-10-24 HISTORY — DX: Schizophrenia, unspecified: F20.9

## 2019-10-24 HISTORY — DX: Delusional disorders: F22

## 2019-10-24 LAB — CBC WITH DIFFERENTIAL/PLATELET
Abs Immature Granulocytes: 0.04 10*3/uL (ref 0.00–0.07)
Basophils Absolute: 0 10*3/uL (ref 0.0–0.1)
Basophils Relative: 0 %
Eosinophils Absolute: 0 10*3/uL (ref 0.0–0.5)
Eosinophils Relative: 0 %
HCT: 44.7 % (ref 39.0–52.0)
Hemoglobin: 14.7 g/dL (ref 13.0–17.0)
Immature Granulocytes: 0 %
Lymphocytes Relative: 7 %
Lymphs Abs: 0.8 10*3/uL (ref 0.7–4.0)
MCH: 29 pg (ref 26.0–34.0)
MCHC: 32.9 g/dL (ref 30.0–36.0)
MCV: 88.2 fL (ref 80.0–100.0)
Monocytes Absolute: 1 10*3/uL (ref 0.1–1.0)
Monocytes Relative: 8 %
Neutro Abs: 10.1 10*3/uL — ABNORMAL HIGH (ref 1.7–7.7)
Neutrophils Relative %: 85 %
Platelets: 297 10*3/uL (ref 150–400)
RBC: 5.07 MIL/uL (ref 4.22–5.81)
RDW: 13.9 % (ref 11.5–15.5)
WBC: 12 10*3/uL — ABNORMAL HIGH (ref 4.0–10.5)
nRBC: 0 % (ref 0.0–0.2)

## 2019-10-24 LAB — COMPREHENSIVE METABOLIC PANEL
ALT: 22 U/L (ref 0–44)
AST: 27 U/L (ref 15–41)
Albumin: 4.5 g/dL (ref 3.5–5.0)
Alkaline Phosphatase: 50 U/L (ref 38–126)
Anion gap: 10 (ref 5–15)
BUN: 20 mg/dL (ref 6–20)
CO2: 25 mmol/L (ref 22–32)
Calcium: 8.8 mg/dL — ABNORMAL LOW (ref 8.9–10.3)
Chloride: 103 mmol/L (ref 98–111)
Creatinine, Ser: 1.2 mg/dL (ref 0.61–1.24)
GFR calc Af Amer: >60 mL/min (ref >60)
GFR calc non Af Amer: >60 mL/min (ref >60)
Glucose, Bld: 96 mg/dL (ref 70–99)
Potassium: 3.8 mmol/L (ref 3.5–5.1)
Sodium: 138 mmol/L (ref 135–145)
Total Bilirubin: 0.4 mg/dL (ref 0.3–1.2)
Total Protein: 7.5 g/dL (ref 6.5–8.1)

## 2019-10-24 LAB — ETHANOL: Alcohol, Ethyl (B): <10 mg/dL (ref <10)

## 2019-10-24 MED ORDER — NALOXONE HCL 4 MG/0.1ML NA LIQD
2.0000 | Freq: Once | NASAL | Status: DC
  Filled 2019-10-24: qty 4

## 2019-10-24 MED ORDER — SODIUM CHLORIDE 0.9 % IV BOLUS: 1000.0000 mL | Freq: Once | INTRAVENOUS | Status: DC
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
## 2019-10-24 NOTE — ED Notes (Signed)
Pt reminded we need urine sample. Pt provided two glasses of water at this time and urinal. Pt unable to void bladder at this time.
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
## 2019-10-25 MED ORDER — ONDANSETRON 8 MG PO TBDP
8.0000 mg | ORAL_TABLET | Freq: Once | ORAL | Status: AC
  Administered 2019-10-25: 8 mg via ORAL
  Filled 2019-10-25: qty 1

## 2019-10-25 MED ORDER — ONDANSETRON 4 MG PO TBDP: ORAL_TABLET | ORAL | 0 refills | Status: AC

## 2019-10-25 NOTE — ED Notes (Signed)
While this RN was assisting another nurse, Pt ambulated to restroom and failed to provide urine sample. Pt returned to room and went back to sleep. Pt reports he is from [place] and does not know anyone local here to provide transportation and Pt unable to recall anyone's phone number to call for assistance. Pt still presents under the influence of unknown substance and is having memory difficulties and serial additions.

## 2019-10-25 NOTE — ED Notes (Signed)
Pt still refusing to allow staff to establish IV access to administer IV Fluids. Pt still unable to provide urine sample at this time. When asked, Pt moved bedside urinal to his mouth and did not attempt to try to use it correctly after receiving clear instructions on how a urinal is utilized appropriately.

## 2019-10-25 NOTE — ED Notes (Signed)
Per MD request, Pt was observed previously ambulating in hall without assistance.
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
